# Patient Record
Sex: Female | Born: 1980 | Race: White | Hispanic: No | Marital: Married | Smoking: Former smoker
Health system: Southern US, Community
[De-identification: ages and names within clinical notes are randomized; demographics above are authoritative.]

## PROBLEM LIST (undated history)

## (undated) DIAGNOSIS — O99280 Endocrine, nutritional and metabolic diseases complicating pregnancy, unspecified trimester: Secondary | ICD-10-CM

## (undated) DIAGNOSIS — O4290 Premature rupture of membranes, unspecified as to length of time between rupture and onset of labor, unspecified weeks of gestation: Secondary | ICD-10-CM

## (undated) DIAGNOSIS — D6851 Activated protein C resistance: Secondary | ICD-10-CM

## (undated) DIAGNOSIS — Z8619 Personal history of other infectious and parasitic diseases: Secondary | ICD-10-CM

## (undated) DIAGNOSIS — J329 Chronic sinusitis, unspecified: Secondary | ICD-10-CM

## (undated) DIAGNOSIS — I499 Cardiac arrhythmia, unspecified: Secondary | ICD-10-CM

## (undated) DIAGNOSIS — O99119 Other diseases of the blood and blood-forming organs and certain disorders involving the immune mechanism complicating pregnancy, unspecified trimester: Secondary | ICD-10-CM

## (undated) DIAGNOSIS — E7212 Methylenetetrahydrofolate reductase deficiency: Secondary | ICD-10-CM

## (undated) DIAGNOSIS — D649 Anemia, unspecified: Secondary | ICD-10-CM

## (undated) HISTORY — DX: Methylenetetrahydrofolate reductase deficiency: E72.12

## (undated) HISTORY — DX: Cardiac arrhythmia, unspecified: I49.9

## (undated) HISTORY — DX: Endocrine, nutritional and metabolic diseases complicating pregnancy, unspecified trimester: O99.280

## (undated) HISTORY — DX: Anemia, unspecified: D64.9

## (undated) HISTORY — PX: WISDOM TOOTH EXTRACTION: SHX21

## (undated) HISTORY — DX: Activated protein C resistance: D68.51

## (undated) HISTORY — DX: Chronic sinusitis, unspecified: J32.9

## (undated) HISTORY — DX: Personal history of other infectious and parasitic diseases: Z86.19

---

## 2011-03-17 ENCOUNTER — Telehealth: Payer: Self-pay | Admitting: *Deleted

## 2011-03-17 NOTE — Telephone Encounter (Signed)
PER POF [id number] CALLED PATIENT TO INFORM THE PATIENT OF THE NEW DATE AND TIME OF THE APPOINTMENT ON 03-24-2011 STARTING AT 2:30 WITH LAB AND FCOUNSELING APPOINTMENT FOLLOWED BY DR.KHAN AT 3:00 FOR 60 MINUTES PATIENT CONFIRMED OVER THE PHONE THE DATE AND TIME.

## 2011-03-21 ENCOUNTER — Telehealth: Payer: Self-pay | Admitting: *Deleted

## 2011-03-21 NOTE — Telephone Encounter (Signed)
patient called and can not come at 2:30pm gave patient 3:30pm patient confirmed over the phone

## 2011-03-24 ENCOUNTER — Ambulatory Visit: Payer: Self-pay | Admitting: Oncology

## 2011-03-24 ENCOUNTER — Other Ambulatory Visit (HOSPITAL_BASED_OUTPATIENT_CLINIC_OR_DEPARTMENT_OTHER): Payer: BC Managed Care – PPO | Admitting: Oncology

## 2011-03-24 ENCOUNTER — Ambulatory Visit (HOSPITAL_BASED_OUTPATIENT_CLINIC_OR_DEPARTMENT_OTHER): Payer: BC Managed Care – PPO | Admitting: Oncology

## 2011-03-24 ENCOUNTER — Ambulatory Visit: Payer: BC Managed Care – PPO

## 2011-03-24 ENCOUNTER — Other Ambulatory Visit (HOSPITAL_BASED_OUTPATIENT_CLINIC_OR_DEPARTMENT_OTHER): Payer: BC Managed Care – PPO | Admitting: Lab

## 2011-03-24 VITALS — BP 100/63 | HR 50 | Temp 98.3°F | Ht 67.0 in | Wt 166.4 lb

## 2011-03-24 DIAGNOSIS — C50519 Malignant neoplasm of lower-outer quadrant of unspecified female breast: Secondary | ICD-10-CM

## 2011-03-24 DIAGNOSIS — O0000 Abdominal pregnancy without intrauterine pregnancy: Secondary | ICD-10-CM

## 2011-03-24 DIAGNOSIS — Z832 Family history of diseases of the blood and blood-forming organs and certain disorders involving the immune mechanism: Secondary | ICD-10-CM

## 2011-03-24 DIAGNOSIS — D696 Thrombocytopenia, unspecified: Secondary | ICD-10-CM | POA: Insufficient documentation

## 2011-03-24 DIAGNOSIS — D682 Hereditary deficiency of other clotting factors: Secondary | ICD-10-CM

## 2011-03-24 DIAGNOSIS — D689 Coagulation defect, unspecified: Secondary | ICD-10-CM

## 2011-03-24 DIAGNOSIS — D6859 Other primary thrombophilia: Secondary | ICD-10-CM

## 2011-03-24 DIAGNOSIS — E7212 Methylenetetrahydrofolate reductase deficiency: Secondary | ICD-10-CM

## 2011-03-24 DIAGNOSIS — C7951 Secondary malignant neoplasm of bone: Secondary | ICD-10-CM

## 2011-03-24 DIAGNOSIS — IMO0002 Reserved for concepts with insufficient information to code with codable children: Secondary | ICD-10-CM

## 2011-03-24 LAB — COMPREHENSIVE METABOLIC PANEL
Albumin: 3.4 g/dL — ABNORMAL LOW (ref 3.5–5.2)
Alkaline Phosphatase: 70 U/L (ref 39–117)
CO2: 26 mEq/L (ref 19–32)
Glucose, Bld: 89 mg/dL (ref 70–99)
Potassium: 3.5 mEq/L (ref 3.5–5.3)
Sodium: 135 mEq/L (ref 135–145)
Total Protein: 6.3 g/dL (ref 6.0–8.3)

## 2011-03-24 LAB — CBC WITH DIFFERENTIAL/PLATELET
Eosinophils Absolute: 0 10*3/uL (ref 0.0–0.5)
MONO#: 0.5 10*3/uL (ref 0.1–0.9)
MONO%: 7.7 % (ref 0.0–14.0)
NEUT#: 4.7 10*3/uL (ref 1.5–6.5)
RBC: 3.57 10*6/uL — ABNORMAL LOW (ref 3.70–5.45)
RDW: 13.3 % (ref 11.2–14.5)
WBC: 6.8 10*3/uL (ref 3.9–10.3)

## 2011-03-24 MED ORDER — ENOXAPARIN SODIUM 100 MG/ML ~~LOC~~ SOLN: 100.0000 mg | Freq: Every day | SUBCUTANEOUS | Status: DC

## 2011-03-27 ENCOUNTER — Telehealth: Payer: Self-pay | Admitting: Oncology

## 2011-03-27 NOTE — Telephone Encounter (Signed)
called pt lmovm with appt for 12/17 @ 11:15am. asked pt to rtn call to confirm appt

## 2011-03-28 NOTE — Progress Notes (Signed)
Amanda Bean [id number] Jul 26, 1980 30 y.o. 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   REFERRING PHYSICIAN: [name] CNP  HISTORY OF PRESENT ILLNESS:  Amanda Bean is a 30 y.o. female.  Very pleasant female who is accompanied by her husband today. She really has no medical problems. She is currently [redacted] week gestation about to enter her second trimester. She has a family history significant for strokes. Also there was family history notable for mother with factor V Leiden positivity. However she never had miscarriages. Patient describe maternal grandmother also have factor V Leiden and has had strokes. Patient has 3 maternal aunts who also have factor V Leiden mutation. Because of this patient also had her blood checked at went over her OB/GYN and infertility. And she too is found to have the factor V Leiden mutation. She also had the MT HFR gene mutation as well. Patient is heterozygote positive for factor V Leiden she personally has never had any evidence of blood clots. The full hypoechoic panel is reviewed  today by me. This was present in the patient's chart from her primary care physician's office. Clinically she is doing well  PAST MEDICAL HISTORY: No history of blood clots or previous pregnancy losses No history of strokes  FAMILY HISTORY: Patient's mother was recently diagnosed with factor V Leiden positivity. Patient's mother has never had any miscarriages. Paternal grandmother had factor V Leiden and has had strokes. She was the first case in the family. Patient has 3 maternal aunts who also are positive for factor V Leiden PR      Social History History  Substance Use Topics  . Smoking status: Not on file  . Smokeless tobacco: Not on file  . Alcohol Use: Not on file    No Known Allergies  Current Outpatient Prescriptions  Medication Sig Dispense Refill  .  enoxaparin (LOVENOX) 100 MG/ML SOLN Inject 1 mL (100 mg total) into the skin daily.  18.9 mL  12  . Prenatal MV-Min-Fe Fum-FA-DHA (PRENATAL 1 PO) Take 1 each by mouth daily.          REVIEW OF SYSTEMS:  Constitutional: negative Eyes: negative Ears, nose, mouth, throat, and face: negative Respiratory: negative Cardiovascular: negative Gastrointestinal: negative Genitourinary:negative Hematologic/lymphatic: negative Musculoskeletal:negative Neurological: negative  PHYSICAL EXAMINATION:  WUJ:WJXBJ, healthy, no distress, well nourished and well developed SKIN: skin color, texture, turgor are normal HEAD: Normocephalic, No masses, lesions, tenderness or abnormalities EYES: normal, PERRLA, EOMI, Conjunctiva are pink and non-injected, sclera clear EARS: External ears normal OROPHARYNX:no exudate, no erythema, lips, buccal mucosa, and tongue normal and dentition normal  NECK: supple, no adenopathy, no bruits, no JVD, thyroid normal size, non-tender, without nodularity LYMPH:  no palpable lymphadenopathy BREAST:not examined LUNGS: clear to auscultation , and palpation, clear to auscultation and percussion HEART: regular rate & rhythm, no murmurs and no gallops ABDOMEN:abdomen soft, non-tender, normal bowel sounds and no masses or organomegaly BACK: Back symmetric, no curvature., No CVA tenderness EXTREMITIES:no edema, no clubbing, no cyanosis  NEURO: alert & oriented x 3 with fluent speech, no focal motor/sensory deficits, gait normal, reflexes normal and symmetric  ASSESSMENT    30 year old female with as I don't positive factor V Leiden and positive for MT HFR mutation. She is now 6-[redacted] weeks gestation. 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 Patient has never had a personal history of blood clots. However  she is pregnant and she is at risk for clotting due to estrogenic state. Also she is at risk for having early miscarriage due to her underlying  disease. This is patient's first pregnancy. There is really no family history of present early pregnancy loss. Patient and I and her husband discussed at great length the diagnosis of hypocoagulability. She understands. She is also quite scared about the chance of losing her baby. We therefore recommended and discussed the possibility of using anticoagulation with Lovenox for the duration of her pregnancy and postpartum.    PLAN:    Recommendation  has been made to begin Lovenox at 1.5 mg per kilogram on a daily basis. This would be throughout her pregnancy. She was taught how to give herself injections. Postpartum I will recommend her remaining on the Lovenox for at least 6-8 weeks. She understands the risks and benefits of this. Obviously few weeks prior to her delivery date we would switch her to unfractionated heparin. [place] would be to have a safer delivery.  The patient and her husband  demonstrated understanding. And a prescription was sent to her pharmacy for Lovenox. We will monitor her CBC as well as low molecular weight heparin levels. I will plan on seeing her back in a few weeks time.     All questions were answered. The patient knows to call the clinic with any problems, questions or concerns. We can certainly see the patient much sooner if necessary.  Thank you so much for allowing me to participate in the care of Amanda Bean. I will continue to follow up the patient with you and assist in her care.  I spent 40 minutes counseling the patient face to face. The total time spent in the appointment was 60 minutes. Drue Second, MD Medical/Oncology [facility] [phone] (beeper) [phone] (Office)  03/28/2011, 5:55 PM 03/28/2011, 5:55 PM

## 2011-03-29 LAB — OB RESULTS CONSOLE RPR: RPR: NONREACTIVE

## 2011-03-29 LAB — OB RESULTS CONSOLE ANTIBODY SCREEN: Antibody Screen: NEGATIVE

## 2011-03-29 LAB — OB RESULTS CONSOLE RUBELLA ANTIBODY, IGM: Rubella: IMMUNE

## 2011-03-29 LAB — OB RESULTS CONSOLE GC/CHLAMYDIA: Gonorrhea: NEGATIVE

## 2011-04-21 ENCOUNTER — Telehealth: Payer: Self-pay | Admitting: *Deleted

## 2011-04-21 NOTE — Telephone Encounter (Signed)
patient changed appointment to 05-31-2011 starting at 3:00pm

## 2011-04-21 NOTE — Telephone Encounter (Signed)
return patient call on 04-21-2011

## 2011-04-24 ENCOUNTER — Other Ambulatory Visit: Payer: BC Managed Care – PPO | Admitting: Lab

## 2011-04-24 ENCOUNTER — Ambulatory Visit: Payer: BC Managed Care – PPO | Admitting: Oncology

## 2011-05-08 ENCOUNTER — Inpatient Hospital Stay (HOSPITAL_COMMUNITY): Admission: AD | Admit: 2011-05-08 | Payer: Self-pay | Source: Ambulatory Visit | Admitting: Obstetrics and Gynecology

## 2011-05-09 NOTE — L&D Delivery Note (Signed)
Cesarean Section Procedure Note  Indications: failure to progress: arrest of descent and failure to progress: arrest of dilation  Pre-operative Diagnosis: 39 week 6 day pregnancy.  Post-operative Diagnosis: same  Surgeon: Genia Del   Assistants: [name]  Anesthesia: Epidural anesthesia  ASA Class: 1   Procedure Details   The patient was seen in the Holding Room. The risks, benefits, complications, treatment options, and expected outcomes were discussed with the patient.  The patient concurred with the proposed plan, giving informed consent.  The site of surgery properly noted/marked. The patient was taken to Operating Room # 2, identified as Amanda Bean and the procedure verified as C-Section Delivery. A Time Out was held and the above information confirmed.  After induction of anesthesia, the patient was draped and prepped in the usual sterile manner. A Pfannenstiel incision was made and carried down through the subcutaneous tissue to the fascia. Fascial incision was made and extended transversely. The fascia was separated from the underlying rectus tissue superiorly and inferiorly. The peritoneum was identified and entered. Peritoneal incision was extended longitudinally. The utero-vesical peritoneal reflection was incised transversely and the bladder flap was bluntly freed from the lower uterine segment. A low transverse uterine incision was made. Delivered from cephalic presentation, occiput posterior Female with Apgar scores of 7 at one minute and 8 at five minutes. After the umbilical cord was clamped and cut cord blood was obtained for evaluation. The placenta was removed intact and appeared normal.  Pitocin and Methergine were given for uterine contraction. The uterine outline, tubes and ovaries appeared normal. The uterine incision was closed with running locked sutures of Vicryl-0, a second layer in a mattress stitch of Vicryl-0 was added.  Hemostasis was observed.  Lavage was carried out until clear. The fascia was then reapproximated with running sutures of Vicryl. The skin was reapproximated with Staples.  Instrument, sponge, and needle counts were correct prior the abdominal closure and at the conclusion of the case.    Estimated Blood Loss:  600 cc                   Specimens: Placenta         Complications:  None; patient tolerated the procedure well.         Disposition: PACU - hemodynamically stable.         Condition: stable  Attending Attestation: I was present and scrubbed for the entire procedure.  Genia Del MD  09/30/2011 at 12:53 am

## 2011-05-31 ENCOUNTER — Ambulatory Visit (HOSPITAL_BASED_OUTPATIENT_CLINIC_OR_DEPARTMENT_OTHER): Payer: BC Managed Care – PPO | Admitting: Oncology

## 2011-05-31 ENCOUNTER — Other Ambulatory Visit (HOSPITAL_BASED_OUTPATIENT_CLINIC_OR_DEPARTMENT_OTHER): Payer: BC Managed Care – PPO | Admitting: Lab

## 2011-05-31 VITALS — BP 106/67 | HR 67 | Temp 98.4°F | Wt 174.0 lb

## 2011-05-31 DIAGNOSIS — Z5181 Encounter for therapeutic drug level monitoring: Secondary | ICD-10-CM

## 2011-05-31 DIAGNOSIS — E7210 Disorders of sulfur-bearing amino-acid metabolism, unspecified: Secondary | ICD-10-CM

## 2011-05-31 DIAGNOSIS — D689 Coagulation defect, unspecified: Secondary | ICD-10-CM

## 2011-05-31 DIAGNOSIS — Z7901 Long term (current) use of anticoagulants: Secondary | ICD-10-CM

## 2011-05-31 DIAGNOSIS — D6859 Other primary thrombophilia: Secondary | ICD-10-CM

## 2011-05-31 DIAGNOSIS — D682 Hereditary deficiency of other clotting factors: Secondary | ICD-10-CM

## 2011-05-31 DIAGNOSIS — D696 Thrombocytopenia, unspecified: Secondary | ICD-10-CM

## 2011-05-31 LAB — CBC WITH DIFFERENTIAL/PLATELET
Eosinophils Absolute: 0.1 10*3/uL (ref 0.0–0.5)
MCV: 88.1 fL (ref 79.5–101.0)
MONO#: 0.7 10*3/uL (ref 0.1–0.9)
MONO%: 7.8 % (ref 0.0–14.0)
NEUT#: 6.2 10*3/uL (ref 1.5–6.5)
RBC: 3.37 10*6/uL — ABNORMAL LOW (ref 3.70–5.45)
RDW: 13.2 % (ref 11.2–14.5)
WBC: 9.1 10*3/uL (ref 3.9–10.3)
nRBC: 0 % (ref 0–0)

## 2011-05-31 LAB — PROTIME-INR: INR: 0.9 — ABNORMAL LOW (ref 2.00–3.50)

## 2011-05-31 NOTE — Progress Notes (Signed)
  OFFICE PROGRESS NOTE  CC  [name] CNP  DIAGNOSIS:  31 year old female with factor V Leiden and MT HFR mutation who is [redacted] weeks gestation  PRIOR THERAPY: 1. patient is currently [redacted] weeks pregnant and was started on Lovenox on 03/24/2011  CURRENT THERAPY: Lovenox subcutaneous daily for duration of her pregnancy.  INTERVAL HISTORY: Amanda Bean 31 y.o. female returns for followup visit today. Overall she is doing well she is without any significant complaints. She is tolerating the Lovenox very well without any problems. She is denying any fevers chills night sweats headaches  MEDICAL HISTORY:No past medical history on file.  ALLERGIES:   has no known allergies.  MEDICATIONS:  Current Outpatient Prescriptions  Medication Sig Dispense Refill  . enoxaparin (LOVENOX) 100 MG/ML SOLN Inject 1 mL (100 mg total) into the skin daily.  18.9 mL  12  . Prenatal MV-Min-Fe Fum-FA-DHA (PRENATAL 1 PO) Take 1 each by mouth daily.          SURGICAL HISTORY: No past surgical history on file.  REVIEW OF SYSTEMS:  Pertinent items are noted in HPI.   PHYSICAL EXAMINATION: Neck: no adenopathy, no carotid bruit, no JVD, supple, symmetrical, trachea midline and thyroid not enlarged, symmetric, no tenderness/mass/nodules Resp: clear to auscultation bilaterally and normal percussion bilaterally Cardio: regular rate and rhythm, S1, S2 normal, no murmur, click, rub or gallop GI: soft, non-tender; bowel sounds normal; no masses,  no organomegaly and Abdomen is protruded  secondary to pregnancy Extremities: extremities normal, atraumatic, no cyanosis or edema Neurologic: Alert and oriented X 3, normal strength and tone. Normal symmetric reflexes. Normal coordination and gait  ECOG PERFORMANCE STATUS: 0 - Asymptomatic  Blood pressure 106/67, pulse 67, temperature 98.4 F (36.9 C), temperature source Oral, weight 174 lb (78.926 kg).  LABORATORY DATA: Lab Results  Component Value Date   WBC 9.1  05/31/2011   HGB 10.4* 05/31/2011   HCT 29.7* 05/31/2011   MCV 88.1 05/31/2011   PLT 184 05/31/2011      Chemistry      Component Value Date/Time   NA 135 03/24/2011 1625   K 3.5 03/24/2011 1625   CL 101 03/24/2011 1625   CO2 26 03/24/2011 1625   BUN 12 03/24/2011 1625   CREATININE 0.65 03/24/2011 1625      Component Value Date/Time   CALCIUM 9.1 03/24/2011 1625   ALKPHOS 70 03/24/2011 1625   AST 16 03/24/2011 1625   ALT 19 03/24/2011 1625   BILITOT 0.5 03/24/2011 1625       RADIOGRAPHIC STUDIES:  No results found.  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 Patient is currently on Lovenox to prevent pregnancy loss. Overall she is doing well.   PLAN: I will plan on seeing the patient back in early May. Her expected date of delivery is towards the end of May around May 25. About 2 weeks prior to her delivery we will plan on putting her on unfractionated heparin. All questions were answered.   All questions were answered. The patient knows to call the clinic with any problems, questions or concerns. We can certainly see the patient much sooner if necessary.  I spent 20 minutes counseling the patient face to face. The total time spent in the appointment was 30 minutes.    Drue Second, MD Medical/Oncology [facility] [phone] (beeper) [phone] (Office)  05/31/2011, 9:06 PM

## 2011-08-23 ENCOUNTER — Telehealth: Payer: Self-pay | Admitting: *Deleted

## 2011-08-23 NOTE — Telephone Encounter (Signed)
Pt called with concerns:  " I will run out of Lovenox in about 10 days.  Do I refill it or should I go on the other medication instead?  The Lovenox is $100.00 and If I refill it and only use enough  until I switch to the other medication, then will I use the rest of it after the baby is born."   Pt due date 05/25.   F/U with MD 09/08/11- pt will run out of Lovenox prior to MD visit.  Will review with MD

## 2011-08-23 NOTE — Telephone Encounter (Signed)
Per MD, pt to refill lovenox and continue taking as directed. Pt will restart Lovenox after baby is born.   MD will review with pt "new medication" unfractionated heparin at next office visit 09/08/11. Pt verbalized understanding.

## 2011-08-23 NOTE — Telephone Encounter (Signed)
Patient should refill on lovenox

## 2011-08-29 ENCOUNTER — Telehealth: Payer: Self-pay | Admitting: Medical Oncology

## 2011-08-29 NOTE — Telephone Encounter (Signed)
Received call from OBGYN for Amanda Bean currently [redacted] weeks gestation and receiving Lovenox.  OB requesting patient be transitioned to Heparin between 36-[redacted] weeks gestation instead of waiting until [redacted] weeks gestation in case patient goes into labor early.  Patient scheduled to see Dr. Welton Flakes Sep 08, 2011  Will review with MD

## 2011-08-30 NOTE — Telephone Encounter (Signed)
We will switch her to unfractionated heparin on her next visit with me

## 2011-08-30 NOTE — Telephone Encounter (Signed)
Returned call to [name] CNM at [facility].  Per Dr. Welton Flakes patient to be switched to unfractionated herparin after next office visit on 5/3  Per [name], this should be fine.

## 2011-09-04 LAB — OB RESULTS CONSOLE GBS: GBS: NEGATIVE

## 2011-09-08 ENCOUNTER — Other Ambulatory Visit: Payer: BC Managed Care – PPO | Admitting: Lab

## 2011-09-08 ENCOUNTER — Ambulatory Visit (HOSPITAL_BASED_OUTPATIENT_CLINIC_OR_DEPARTMENT_OTHER): Payer: BC Managed Care – PPO | Admitting: Oncology

## 2011-09-08 ENCOUNTER — Other Ambulatory Visit: Payer: Self-pay | Admitting: *Deleted

## 2011-09-08 VITALS — BP 117/75 | HR 71 | Temp 98.4°F | Ht 67.0 in | Wt 191.0 lb

## 2011-09-08 DIAGNOSIS — D6859 Other primary thrombophilia: Secondary | ICD-10-CM

## 2011-09-08 DIAGNOSIS — O99119 Other diseases of the blood and blood-forming organs and certain disorders involving the immune mechanism complicating pregnancy, unspecified trimester: Secondary | ICD-10-CM

## 2011-09-08 DIAGNOSIS — Z862 Personal history of diseases of the blood and blood-forming organs and certain disorders involving the immune mechanism: Secondary | ICD-10-CM

## 2011-09-08 LAB — CBC WITH DIFFERENTIAL/PLATELET
BASO%: 0.4 % (ref 0.0–2.0)
HCT: 33.3 % — ABNORMAL LOW (ref 34.8–46.6)
MCHC: 34.2 g/dL (ref 31.5–36.0)
MONO#: 0.8 10*3/uL (ref 0.1–0.9)
NEUT%: 66.3 % (ref 38.4–76.8)
RDW: 13.5 % (ref 11.2–14.5)
WBC: 8.7 10*3/uL (ref 3.9–10.3)
lymph#: 2.1 10*3/uL (ref 0.9–3.3)
nRBC: 0 % (ref 0–0)

## 2011-09-08 LAB — HEPARIN ANTI-XA: Heparin LMW: 0.33 IU/mL

## 2011-09-08 MED ORDER — HEPARIN SODIUM (PORCINE) PF 5000 UNIT/0.5ML IJ SOLN: 10000.0000 [IU] | Freq: Two times a day (BID) | INTRAMUSCULAR | Status: DC

## 2011-09-11 ENCOUNTER — Telehealth: Payer: Self-pay | Admitting: Medical Oncology

## 2011-09-11 NOTE — Telephone Encounter (Signed)
[facility] pharmacy called on 5/3 will order PF heprin for pt. Notified pt her rx will be at [facility] as this is a difficult medication for some pharmacies to order or stock.  LMOVM for pt to call back confirming she is aware her Medication is at [facility]. Gave pt [facility] outpt pharmacy #

## 2011-09-13 NOTE — Progress Notes (Signed)
OFFICE PROGRESS NOTE  CC  [name] CNP  DIAGNOSIS:  31 year old female with factor V Leiden and MT HFR mutation who is [redacted] weeks gestation  PRIOR THERAPY: 1. patient is currently [redacted] weeks pregnant and was started on Lovenox on 03/24/2011  CURRENT THERAPY: Convert to preservative free unfractionated heparin  INTERVAL HISTORY: TAYLEIGH WETHERELL 31 y.o. female returns for followup visit today. Overall she is doing well she is without any significant complaints. She is tolerating the Lovenox very well without any problems. She is denying any fevers chills night sweats headaches  MEDICAL HISTORY:No past medical history on file.  ALLERGIES:   has no known allergies.  MEDICATIONS:  Current Outpatient Prescriptions  Medication Sig Dispense Refill  . enoxaparin (LOVENOX) 100 MG/ML SOLN Inject 1 mL (100 mg total) into the skin daily.  18.9 mL  12  . Prenatal MV-Min-Fe Fum-FA-DHA (PRENATAL 1 PO) Take 1 each by mouth daily.        . Heparin Sodium, Porcine, PF 5000 UNIT/0.5ML SOLN Inject 10,000 Units as directed every 12 (twelve) hours.  14 Syringe  5    SURGICAL HISTORY: No past surgical history on file.  REVIEW OF SYSTEMS:  Pertinent items are noted in HPI.   PHYSICAL EXAMINATION: Neck: no adenopathy, no carotid bruit, no JVD, supple, symmetrical, trachea midline and thyroid not enlarged, symmetric, no tenderness/mass/nodules Resp: clear to auscultation bilaterally and normal percussion bilaterally Cardio: regular rate and rhythm, S1, S2 normal, no murmur, click, rub or gallop GI: soft, non-tender; bowel sounds normal; no masses,  no organomegaly and Abdomen is protruded  secondary to pregnancy Extremities: extremities normal, atraumatic, no cyanosis or edema Neurologic: Alert and oriented X 3, normal strength and tone. Normal symmetric reflexes. Normal coordination and gait  ECOG PERFORMANCE STATUS: 0 - Asymptomatic  Blood pressure 117/75, pulse 71, temperature 98.4 F (36.9 C),  temperature source Oral, height 5\' 7"  (1.702 m), weight 191 lb (86.637 kg).  LABORATORY DATA: Lab Results  Component Value Date   WBC 8.7 09/08/2011   HGB 11.4* 09/08/2011   HCT 33.3* 09/08/2011   MCV 93.9 09/08/2011   PLT 152 09/08/2011      Chemistry      Component Value Date/Time   NA 135 03/24/2011 1625   K 3.5 03/24/2011 1625   CL 101 03/24/2011 1625   CO2 26 03/24/2011 1625   BUN 12 03/24/2011 1625   CREATININE 0.65 03/24/2011 1625      Component Value Date/Time   CALCIUM 9.1 03/24/2011 1625   ALKPHOS 70 03/24/2011 1625   AST 16 03/24/2011 1625   ALT 19 03/24/2011 1625   BILITOT 0.5 03/24/2011 1625       RADIOGRAPHIC STUDIES:  No results found.  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 Patient is currently on Lovenox to prevent pregnancy loss. Overall she is doing well.   PLAN: Today we will switch her over to  Unfractionated preservative free heparin 10,000 units to be injected Day until patient goes into labor then this can be continued. Once she has delivered her baby then she certainly can go back on Lovenox. We will plan on continuing the Lovenox postpartum for 6-8 weeks time to help prevent postpartum thrombosis in the patient. All of this was explained to the patient and she is in agreement. I will plan on seeing the patient back after she delivers her child. For the heparin was sent to patient's pharmacy. And she was instructed on how to inject  herself.   All questions were answered. The patient knows to call the clinic with any problems, questions or concerns. We can certainly see the patient much sooner if necessary.  I spent 20 minutes counseling the patient face to face. The total time spent in the appointment was 30 minutes.    Drue Second, MD Medical/Oncology [facility] [phone] (beeper) [phone] (Office)  09/13/2011, 9:49 AM

## 2011-09-25 ENCOUNTER — Telehealth (HOSPITAL_COMMUNITY): Payer: Self-pay | Admitting: *Deleted

## 2011-09-25 ENCOUNTER — Encounter (HOSPITAL_COMMUNITY): Payer: Self-pay | Admitting: *Deleted

## 2011-09-25 NOTE — Telephone Encounter (Signed)
Preadmission screen  

## 2011-09-28 ENCOUNTER — Inpatient Hospital Stay (HOSPITAL_COMMUNITY)
Admission: RE | Admit: 2011-09-28 | Discharge: 2011-10-03 | DRG: 371 | Disposition: A | Discharge status: Home / Self Care | Payer: BC Managed Care – PPO | Source: Ambulatory Visit | Attending: Obstetrics and Gynecology | Admitting: Obstetrics and Gynecology

## 2011-09-28 ENCOUNTER — Encounter (HOSPITAL_COMMUNITY): Payer: Self-pay

## 2011-09-28 DIAGNOSIS — O34219 Maternal care for unspecified type scar from previous cesarean delivery: Secondary | ICD-10-CM | POA: Diagnosis present

## 2011-09-28 DIAGNOSIS — D689 Coagulation defect, unspecified: Secondary | ICD-10-CM | POA: Diagnosis present

## 2011-09-28 DIAGNOSIS — O99119 Other diseases of the blood and blood-forming organs and certain disorders involving the immune mechanism complicating pregnancy, unspecified trimester: Secondary | ICD-10-CM | POA: Diagnosis present

## 2011-09-28 DIAGNOSIS — D6851 Activated protein C resistance: Secondary | ICD-10-CM

## 2011-09-28 HISTORY — DX: Other diseases of the blood and blood-forming organs and certain disorders involving the immune mechanism complicating pregnancy, unspecified trimester: D68.51

## 2011-09-28 HISTORY — DX: Other diseases of the blood and blood-forming organs and certain disorders involving the immune mechanism complicating pregnancy, unspecified trimester: O99.119

## 2011-09-28 HISTORY — DX: Activated protein C resistance: D68.51

## 2011-09-28 LAB — CBC
MCV: 92.9 fL (ref 78.0–100.0)
Platelets: 158 10*3/uL (ref 150–400)
RBC: 3.64 MIL/uL — ABNORMAL LOW (ref 3.87–5.11)
WBC: 11.6 10*3/uL — ABNORMAL HIGH (ref 4.0–10.5)

## 2011-09-28 MED ORDER — MISOPROSTOL 25 MCG QUARTER TABLET
25.0000 ug | ORAL_TABLET | ORAL | Status: DC | PRN
  Administered 2011-09-28: 25 ug via VAGINAL
  Filled 2011-09-28: qty 0.25

## 2011-09-28 MED ORDER — LIDOCAINE HCL (PF) 1 % IJ SOLN: 30.0000 mL | INTRAMUSCULAR | Status: DC | PRN

## 2011-09-28 MED ORDER — OXYCODONE-ACETAMINOPHEN 5-325 MG PO TABS: 1.0000 | ORAL_TABLET | ORAL | Status: DC | PRN

## 2011-09-28 MED ORDER — LACTATED RINGERS IV SOLN
500.0000 mL | INTRAVENOUS | Status: DC | PRN
  Administered 2011-09-29: 125 mL via INTRAVENOUS
  Administered 2011-09-29 (×2): 500 mL via INTRAVENOUS
  Administered 2011-09-29: 125 mL via INTRAVENOUS

## 2011-09-28 MED ORDER — FERROUS FUMARATE 325 (106 FE) MG PO TABS
1.0000 | ORAL_TABLET | Freq: Every day | ORAL | Status: DC
  Filled 2011-09-28 (×2): qty 1

## 2011-09-28 MED ORDER — IBUPROFEN 600 MG PO TABS: 600.0000 mg | ORAL_TABLET | Freq: Four times a day (QID) | ORAL | Status: DC | PRN

## 2011-09-28 MED ORDER — ACETAMINOPHEN 325 MG PO TABS
650.0000 mg | ORAL_TABLET | ORAL | Status: DC | PRN
  Administered 2011-09-29: 650 mg via ORAL
  Filled 2011-09-28: qty 2

## 2011-09-28 MED ORDER — OXYTOCIN 10 UNIT/ML IJ SOLN: 10.0000 [IU] | Freq: Once | INTRAMUSCULAR | Status: DC

## 2011-09-28 MED ORDER — CITRIC ACID-SODIUM CITRATE 334-500 MG/5ML PO SOLN
30.0000 mL | ORAL | Status: DC | PRN
  Administered 2011-09-29: 30 mL via ORAL
  Filled 2011-09-28: qty 15

## 2011-09-28 MED ORDER — NALBUPHINE SYRINGE 5 MG/0.5 ML
5.0000 mg | INJECTION | INTRAMUSCULAR | Status: DC | PRN
  Administered 2011-09-29 (×2): 5 mg via INTRAVENOUS
  Filled 2011-09-28 (×2): qty 0.5

## 2011-09-28 MED ORDER — TERBUTALINE SULFATE 1 MG/ML IJ SOLN: 0.2500 mg | Freq: Once | INTRAMUSCULAR | Status: AC | PRN

## 2011-09-28 MED ORDER — FLEET ENEMA 7-19 GM/118ML RE ENEM: 1.0000 | ENEMA | RECTAL | Status: DC | PRN

## 2011-09-28 MED ORDER — OXYTOCIN 20 UNITS IN LACTATED RINGERS INFUSION - SIMPLE: 125.0000 mL/h | Freq: Once | INTRAVENOUS | Status: DC

## 2011-09-28 MED ORDER — ZOLPIDEM TARTRATE 10 MG PO TABS
10.0000 mg | ORAL_TABLET | Freq: Every evening | ORAL | Status: DC | PRN
  Administered 2011-09-28: 10 mg via ORAL
  Filled 2011-09-28: qty 1

## 2011-09-28 MED ORDER — ONDANSETRON HCL 4 MG/2ML IJ SOLN
4.0000 mg | Freq: Four times a day (QID) | INTRAMUSCULAR | Status: DC | PRN
  Administered 2011-09-29: 4 mg via INTRAVENOUS
  Filled 2011-09-28: qty 2

## 2011-09-28 MED ORDER — OXYTOCIN BOLUS FROM INFUSION
500.0000 mL | Freq: Once | INTRAVENOUS | Status: DC
  Filled 2011-09-28: qty 1000
  Filled 2011-09-28: qty 500

## 2011-09-28 NOTE — Progress Notes (Signed)
Amanda Bean is a 31 y.o. G1P0 at [redacted]w[redacted]d by LMP admitted for induction of labor due to Low amniotic fluid. and FVL on anticoag therapy.  Subjective: Comfortable, good FM.  Objective: Filed Vitals:   09/28/11 2050  BP: 140/88  Pulse: 76  Temp: 97.9 F (36.6 C)  TempSrc: Oral  Resp: 20  Height: 5\' 7"  (1.702 m)  Weight: 86.183 kg (190 lb)          FHT:  FHR: 120 bpm, variability: moderate,  accelerations:  Present,  decelerations:  Absent UC:   none SVE:    1-2 / 70/-2 Vertex, memb intact  Labs:   Basename 09/28/11 2050  WBC 11.6*  HGB 11.9*  HCT 33.8*  PLT 158    Assessment / Plan: Induction of labor due to oligo / FVL Cervical balloon placed with moderate difficulty, sterile spec used, inflated w/ 60 cc NS Cytotec to posterior cervix q4 hrs PRN Labor: cervical ripening initiated. Preeclampsia:  n/a Fetal Wellbeing:  Category I Pain Control:  IV narcotics PRN overnight, plan epidural in active labor I/D:  GBS neg Anticipated MOD:  cautious attempt at NSVB  Brice Kossman 09/28/2011, 8:38 PM

## 2011-09-28 NOTE — H&P (Signed)
Amanda Bean is a 31 y.o. G1P0 at [redacted]w[redacted]d presenting for IOL.  Good fetal movement, No vaginal bleeding, no leaking fluid.  Prenatal Course Source of Care: [facility] Renae Fickle, CNM primary,  with onset of care at 8 weeks Pregnancy complications or risk: FVL and MTHFR mutation, on anticoagulation therapy, managed by heme - Dr. Welton Flakes Was on Lovenox 100 mg SQ daily until 37 wks, converted to heparin BID, last dose 5/22 pm. Oligo 4.4 cm on 5/22, BPP 8/8 and NST reactive Rh negative, rhogam given at 28 wks Low-lying placenta resolved by 22 wks Mild anemia supplemented with oral Fe  Current medications: Prescriptions prior to admission  Medication Sig Dispense Refill  . acetaminophen (TYLENOL) 325 MG suppository Place 325 mg rectally every 4 (four) hours as needed. Pain      . enoxaparin (LOVENOX) 100 MG/ML SOLN Inject 1 mL (100 mg total) into the skin daily.  18.9 mL  12  . ferrous fumarate (HEMOCYTE - 106 MG FE) 325 (106 FE) MG TABS Take 1 tablet by mouth.      . Heparin Sodium, Porcine, PF 5000 UNIT/0.5ML SOLN Inject 10,000 Units as directed every 12 (twelve) hours.  14 Syringe  5  . Prenatal MV-Min-Fe Fum-FA-DHA (PRENATAL 1 PO) Take 1 each by mouth daily.          OB History    Grav Para Term Preterm Abortions TAB SAB Ect Mult Living   1              Past Medical History  Diagnosis Date  . Factor V Leiden mutation   . Anemia   . Dysrhythmia     palpitations  . Unspecified sinusitis (chronic)   . History of chicken pox   . MTHFR deficiency complicating pregnancy, unspecified trimester   . Factor V Leiden mutation complicating pregnancy 09/28/2011   Past Surgical History  Procedure Date  . Wisdom tooth extraction    Family History: family history includes Alcohol abuse in her brother; Anesthesia problems in her cousin; Depression in her maternal grandmother; Factor V Leiden deficiency in her maternal aunt and mother; Other in her maternal aunt and mother; Stroke in her maternal  grandmother; and Thyroid disease in her maternal aunt, maternal grandmother, and mother. Social History:  reports that she quit smoking about 9 years ago. She has never used smokeless tobacco. She reports that she does not drink alcohol or use illicit drugs.  Review of Systems - Negative except as noted     Blood pressure 140/88, pulse 76, temperature 97.9 F (36.6 C), temperature source Oral, resp. rate 20, height 5\' 7"  (1.702 m), weight 86.183 kg (190 lb), last menstrual period 12/24/2010.  Physical Exam: Blood pressure 140/88, pulse 76, temperature 97.9 F (36.6 C), temperature source Oral, resp. rate 20, height 5\' 7"  (1.702 m), weight 86.183 kg (190 lb), last menstrual period 12/24/2010. General: NAD Heart: RRR, no murmurs Lungs: CTA b/l  Abd: Soft, NT, EFW 7  Ext: no edema Neuro: DTRs normal    Membranes: intact Vaginal bleeding: none  Digital Cervical Exam: Dilatation 1-2   Effacement 70  Station -2  Presentation vertex FHR:  Baseline rate 120   Variability moderate  Accelerations present  Decelerations none Contractions: few, mild  Pertinent Labs/Studies:   Lab Results  Component Value Date   WBC 11.6* 09/28/2011   HGB 11.9* 09/28/2011   HCT 33.8* 09/28/2011   MCV 92.9 09/28/2011   PLT 158 09/28/2011     Prenatal labs:  ABO, Rh: O/Negative/-- (11/21 0000) Antibody: Negative (11/21 0000) Rubella:  immune RPR: Nonreactive (11/21 0000)  HBsAg: Negative (11/21 0000)  HIV: Non-reactive (11/21 0000)  GBS: Negative (04/29 0000)  1 hr Glucola wnl  Genetic screening declined Ultrasound: Weeks 18 Result nl female anatomy, low lying posterior placenta resolved by 22 wks Placental synechiae along inferior edge 35 wks sono EFW 6'4, nl AFI  Assessment: 31 y.o. G1P0 at [redacted]w[redacted]d, FVL, +MTHFR, stopped anticoag therapy 24 hours prior to IOL   1. Fetal Wellbeing: Category 1  2. Borderline pelvis 3. Pain Control: desires epidural in active labor 4. GBS: neg   Plan:  1. Admit  to BS, cervical balloon and cytotec for cervical ripening 2. Routine L&D orders 3. Analgesia/anesthesia PRN  4. Will resume Lovenox at 24 hrs PP for 6-12 wks PP per hematology (Dr. Welton Flakes) recommendation     Consultant: Dr. Alesia Banda 09/28/2011, 9:16 PM

## 2011-09-29 ENCOUNTER — Encounter (HOSPITAL_COMMUNITY): Payer: Self-pay | Admitting: Anesthesiology

## 2011-09-29 ENCOUNTER — Encounter (HOSPITAL_COMMUNITY): Payer: Self-pay

## 2011-09-29 ENCOUNTER — Encounter (HOSPITAL_COMMUNITY)
Admission: RE | Disposition: A | Discharge status: Home / Self Care | Payer: Self-pay | Source: Ambulatory Visit | Attending: Obstetrics and Gynecology

## 2011-09-29 ENCOUNTER — Inpatient Hospital Stay (HOSPITAL_COMMUNITY): Payer: BC Managed Care – PPO | Admitting: Anesthesiology

## 2011-09-29 ENCOUNTER — Inpatient Hospital Stay (HOSPITAL_COMMUNITY): Admission: RE | Admit: 2011-09-29 | Payer: BC Managed Care – PPO | Source: Ambulatory Visit

## 2011-09-29 LAB — RPR: RPR Ser Ql: NONREACTIVE

## 2011-09-29 SURGERY — Surgical Case
Anesthesia: Regional

## 2011-09-29 MED ORDER — LIDOCAINE-EPINEPHRINE (PF) 2 %-1:200000 IJ SOLN
INTRAMUSCULAR | Status: AC
  Filled 2011-09-29: qty 20

## 2011-09-29 MED ORDER — LACTATED RINGERS IV SOLN
INTRAVENOUS | Status: DC
  Administered 2011-09-29: 125 mL/h via INTRAUTERINE
  Administered 2011-09-29: 600 mL/h via INTRAUTERINE

## 2011-09-29 MED ORDER — PHENYLEPHRINE 40 MCG/ML (10ML) SYRINGE FOR IV PUSH (FOR BLOOD PRESSURE SUPPORT)
80.0000 ug | PREFILLED_SYRINGE | INTRAVENOUS | Status: DC | PRN
  Filled 2011-09-29 (×2): qty 5

## 2011-09-29 MED ORDER — SODIUM BICARBONATE 8.4 % IV SOLN
INTRAVENOUS | Status: AC
  Filled 2011-09-29: qty 50

## 2011-09-29 MED ORDER — EPHEDRINE 5 MG/ML INJ
10.0000 mg | INTRAVENOUS | Status: DC | PRN
  Filled 2011-09-29 (×2): qty 4

## 2011-09-29 MED ORDER — OXYTOCIN 20 UNITS IN LACTATED RINGERS INFUSION - SIMPLE
1.0000 m[IU]/min | INTRAVENOUS | Status: DC
  Administered 2011-09-29: 2 m[IU]/min via INTRAVENOUS

## 2011-09-29 MED ORDER — TERBUTALINE SULFATE 1 MG/ML IJ SOLN: 0.2500 mg | Freq: Once | INTRAMUSCULAR | Status: DC | PRN

## 2011-09-29 MED ORDER — LACTATED RINGERS IV SOLN
INTRAVENOUS | Status: DC
  Administered 2011-09-29: 125 mL/h via INTRAUTERINE
  Administered 2011-09-30: via INTRAUTERINE

## 2011-09-29 MED ORDER — LACTATED RINGERS IV SOLN
500.0000 mL | Freq: Once | INTRAVENOUS | Status: AC
  Administered 2011-09-29: 500 mL via INTRAVENOUS

## 2011-09-29 MED ORDER — DIPHENHYDRAMINE HCL 50 MG/ML IJ SOLN: 12.5000 mg | INTRAMUSCULAR | Status: DC | PRN

## 2011-09-29 MED ORDER — EPHEDRINE 5 MG/ML INJ: 10.0000 mg | INTRAVENOUS | Status: DC | PRN

## 2011-09-29 MED ORDER — LIDOCAINE HCL (PF) 1 % IJ SOLN
INTRAMUSCULAR | Status: DC | PRN
  Administered 2011-09-29 (×2): 5 mL

## 2011-09-29 MED ORDER — BUPIVACAINE HCL (PF) 0.25 % IJ SOLN
INTRAMUSCULAR | Status: AC
  Filled 2011-09-29: qty 30

## 2011-09-29 MED ORDER — FENTANYL 2.5 MCG/ML BUPIVACAINE 1/10 % EPIDURAL INFUSION (WH - ANES)
14.0000 mL/h | INTRAMUSCULAR | Status: DC
  Administered 2011-09-29 (×5): 14 mL/h via EPIDURAL
  Filled 2011-09-29 (×5): qty 60

## 2011-09-29 MED ORDER — PHENYLEPHRINE 40 MCG/ML (10ML) SYRINGE FOR IV PUSH (FOR BLOOD PRESSURE SUPPORT): 80.0000 ug | PREFILLED_SYRINGE | INTRAVENOUS | Status: DC | PRN

## 2011-09-29 MED ORDER — AMPICILLIN-SULBACTAM SODIUM 3 (2-1) G IJ SOLR
3.0000 g | Freq: Four times a day (QID) | INTRAMUSCULAR | Status: DC
  Administered 2011-09-29 (×2): 3 g via INTRAVENOUS
  Filled 2011-09-29 (×3): qty 3

## 2011-09-29 MED ORDER — MORPHINE SULFATE 0.5 MG/ML IJ SOLN
INTRAMUSCULAR | Status: AC
  Filled 2011-09-29: qty 10

## 2011-09-29 MED ORDER — OXYTOCIN 10 UNIT/ML IJ SOLN
INTRAMUSCULAR | Status: AC
  Filled 2011-09-29: qty 1

## 2011-09-29 SURGICAL SUPPLY — 36 items
CLOTH BEACON ORANGE TIMEOUT ST (SAFETY) ×2 IMPLANT
CONTAINER PREFILL 10% NBF 15ML (MISCELLANEOUS) IMPLANT
ELECT REM PT RETURN 9FT ADLT (ELECTROSURGICAL) ×2
ELECTRODE REM PT RTRN 9FT ADLT (ELECTROSURGICAL) ×1 IMPLANT
EXTRACTOR VACUUM M CUP 4 TUBE (SUCTIONS) IMPLANT
GLOVE BIO SURGEON STRL SZ 6.5 (GLOVE) IMPLANT
GLOVE BIOGEL PI IND STRL 6.5 (GLOVE) ×2 IMPLANT
GLOVE BIOGEL PI IND STRL 7.0 (GLOVE) ×1 IMPLANT
GLOVE BIOGEL PI IND STRL 8.5 (GLOVE) ×1 IMPLANT
GLOVE BIOGEL PI INDICATOR 6.5 (GLOVE) ×2
GLOVE BIOGEL PI INDICATOR 7.0 (GLOVE) ×1
GLOVE BIOGEL PI INDICATOR 8.5 (GLOVE) ×1
GLOVE SURG SS PI 7.5 STRL IVOR (GLOVE) ×4 IMPLANT
GOWN PREVENTION PLUS LG XLONG (DISPOSABLE) ×6 IMPLANT
KIT ABG SYR 3ML LUER SLIP (SYRINGE) IMPLANT
NEEDLE HYPO 22GX1.5 SAFETY (NEEDLE) ×2 IMPLANT
NEEDLE HYPO 25X5/8 SAFETYGLIDE (NEEDLE) ×2 IMPLANT
PACK C SECTION WH (CUSTOM PROCEDURE TRAY) ×2 IMPLANT
RTRCTR C-SECT PINK 25CM LRG (MISCELLANEOUS) IMPLANT
SLEEVE SCD COMPRESS KNEE MED (MISCELLANEOUS) ×2 IMPLANT
SPONGE LAP 18X18 X RAY DECT (DISPOSABLE) ×2 IMPLANT
STAPLER VISISTAT 35W (STAPLE) ×2 IMPLANT
SUT PLAIN 0 NONE (SUTURE) IMPLANT
SUT PLAIN 2 0 XLH (SUTURE) ×2 IMPLANT
SUT VIC AB 0 CT1 27 (SUTURE) ×2
SUT VIC AB 0 CT1 27XBRD ANBCTR (SUTURE) ×2 IMPLANT
SUT VIC AB 0 CTX 36 (SUTURE) ×2
SUT VIC AB 0 CTX36XBRD ANBCTRL (SUTURE) ×2 IMPLANT
SUT VIC AB 2-0 CT1 27 (SUTURE) ×1
SUT VIC AB 2-0 CT1 TAPERPNT 27 (SUTURE) ×1 IMPLANT
SUT VIC AB 3-0 SH 27 (SUTURE)
SUT VIC AB 3-0 SH 27X BRD (SUTURE) IMPLANT
SYR CONTROL 10ML LL (SYRINGE) ×2 IMPLANT
TOWEL OR 17X24 6PK STRL BLUE (TOWEL DISPOSABLE) ×4 IMPLANT
TRAY FOLEY CATH 14FR (SET/KITS/TRAYS/PACK) IMPLANT
WATER STERILE IRR 1000ML POUR (IV SOLUTION) ×4 IMPLANT

## 2011-09-29 NOTE — Progress Notes (Signed)
S: Pelvic pressure present, no further progress since 1800 Dr. Seymour Bars called for consult, manually attempting to rotate OP presentation.   OCeasar Mons Vitals:   09/29/11 2001 09/29/11 2030 09/29/11 2100 09/29/11 2130  BP: 114/94 117/55 117/39 115/75  Pulse: 91 72 88 96  Temp: 97.8 F (36.6 C)     TempSrc: Oral     Resp: 18 18 18 18   Height:      Weight:      SpO2:         FHT:  FHR: 130 bpm, variability: moderate,  accelerations:  Present,  decelerations:  Absent UC:   IUPC q 2-3 min, good pattern but low amplitude ctx, MVU 150-170, occasional tachysysole but FHT's reassuring  SVE:   Dilation: 8 Effacement (%): 90 Station: -1 Exam by:: Dr. Seymour Bars   A / P: Protracted active phase  Fetal Wellbeing:  Category I Pain Control:  Epidural  Anticipated MOD:  Suspect CPD, will continue w/ labor and position changes for 1-2 hours as long as FHR reassuring, since patient strongly not desiring cesarean section. Patient aware likely need for C/S, anticipatory guidance. Patient upset and teary, but understanding.   Amanda Bean 09/29/2011, 10:00 PM

## 2011-09-29 NOTE — Progress Notes (Signed)
S: Regular ctx since AROM at 0410, patient laboring in variable positions - birthing ball, ambulating in room, shower / hydrotherapy. Rpt Nubain dose at 0700. Now ctx stronger and more frequent, using breathing techniques to cope but tired.  O: Filed Vitals:   09/29/11 0630 09/29/11 0704 09/29/11 0732 09/29/11 0838  BP:   125/79 117/58  Pulse:   73 71  Temp:  97.9 F (36.6 C)    TempSrc:  Oral    Resp: 20 20  20   Height:      Weight:         FHT:  FHR: 120, mod variability, + accel's, no decel's. Intermittent EFM UC:   regular, every 2-3 minutes/ palp mod SVE:   Dilation: 6 Effacement (%): 90 Station: -2 Exam by:: Spike Desilets Cervix v. Posterior Suspect OP/acynclitic  A / P: Protracted active phase  Fetal Wellbeing:  Category I Pain Control:  Epidural now  Anticipated MOD:  attempt NSVD, plan IUPC / Pitocin PRN  Amanda Bean 09/29/2011, 9:11 AM

## 2011-09-29 NOTE — Progress Notes (Signed)
S: Doing well, pain controlled. Alternating positions exaggerated lateral to semi fowler.   O: Filed Vitals:   09/29/11 1201 09/29/11 1231 09/29/11 1301 09/29/11 1331  BP: 114/57 90/55 118/76 108/68  Pulse: 69 82 85 89  Temp:   97.5 F (36.4 C)   TempSrc:   Axillary   Resp:  20 18 20   Height:      Weight:      SpO2:         FHT:  FHR: 120 bpm, variability: moderate,  accelerations:  Present,  decelerations:  Present small variable w/ repositioning UC:   Q69min, MVU 190. Pitocin 6 mu /min SVE:   Dilation: 6 Effacement (%): 90 Station: -1 Exam by:: Howard Bunte,CNM Minimal progress, some caput, but acynclitic unresolved  A / P: Protracted labor, inadequate contractile strength Pitocin augmentation,  titrate to MVU>200  Fetal Wellbeing:  Category I Pain Control:  Epidural  Anticipated MOD:  NSVD if able to facilitate fetal descent and rotation, exaggerated lateral postioning, rotate q 30-60 min, increase pitocin to adequate MVU  Joeann Steppe 09/29/2011, 2:07 PM

## 2011-09-29 NOTE — Progress Notes (Signed)
Dr. Seymour Bars at the bedside to discuss risks and benefits of cesearan section delivery. Pt states understanding. Informed consents signed.

## 2011-09-29 NOTE — Progress Notes (Signed)
S: S/P amnioinfusion, Unasyn, Tylenol. Pitocin restarted. Labored in HK position, then lateral again, now reports rectal and pelvic pressure w/ ctx.    O: Filed Vitals:   09/29/11 1732 09/29/11 1801 09/29/11 1831 09/29/11 1901  BP: 130/54 100/67 116/57 120/74  Pulse: 75 74 71 87  Temp:   98.1 F (36.7 C)   TempSrc:   Axillary   Resp: 20 20 18    Height:      Weight:      SpO2:         FHT:  FHR: 130 bpm, variability: moderate,  accelerations:  Present,  decelerations:  Present earlies UC:   IUPC, Q 2-3 min, 150-190 MVU SVE:   Dilation: 8 Effacement (%): 90 Station: -1/0 Exam by:: Renae Fickle, CNM Cervix anterior now, slight edema   A / P: Protracted active phase  Fetal Wellbeing:  Category I Pain Control:  Epidural  Anticipated MOD:  continue w/ attempt at NSVB, slow progress  Amanda Bean 09/29/2011, 6:30 PM

## 2011-09-29 NOTE — Progress Notes (Signed)
S: Doing well, pain w/ ctx since 0100, s/p one dose of Nubain, + show   O: Filed Vitals:   09/28/11 2050 09/28/11 2053  BP: 140/88 140/88  Pulse: 76 76  Temp: 97.9 F (36.6 C)   TempSrc: Oral   Resp: 20   Height: 5\' 7"  (1.702 m)   Weight: 86.183 kg (190 lb)      FHT:  130, mod variability, slightly decreased since Nubain, + accel's, quick variables to 90 x2 UC:   regular, every 2-3 minutes, mild- mod SVE:   Dilation: 6 Effacement (%): 80 Station: -2 Exam by:: Colon Flattery, CNM AROM w/ SVE, clear AF Cervical balloon DCed, + show A / P: Induction of labor due to oligo and FVL,  S/p cervical balloon and cytotec x 1 dose for ripening, will allow for physiologic labor to ensue w/ AROM, Pitocin PRN if no progress in next 3-4 hours.   Fetal Wellbeing:  Category I Pain Control:  Labor support without medications and will consider Epidural PRN, pt. OOB to ambulate for comfort and encourage fetal rotation and descent.  Anticipated MOD:  NSVD / cautious, vertex high, pelvis borderline, flat arch  Keaundra Stehle 09/29/2011, 4:49 AM

## 2011-09-29 NOTE — Progress Notes (Signed)
Dr. Seymour Bars at the bedside to try and manually turn the baby. Will continue to monitor.

## 2011-09-29 NOTE — Anesthesia Preprocedure Evaluation (Signed)
Anesthesia Evaluation  Patient identified by MRN, date of birth, ID band Patient awake    Reviewed: Allergy & Precautions, H&P , Patient's Chart, lab work & pertinent test results  Airway Mallampati: II TM Distance: >3 FB Neck ROM: full    Dental No notable dental hx.    Pulmonary neg pulmonary ROS,  breath sounds clear to auscultation  Pulmonary exam normal       Cardiovascular negative cardio ROS  + dysrhythmias Rhythm:regular Rate:Normal     Neuro/Psych negative neurological ROS  negative psych ROS   GI/Hepatic negative GI ROS, Neg liver ROS,   Endo/Other  negative endocrine ROS  Renal/GU negative Renal ROS     Musculoskeletal   Abdominal   Peds  Hematology negative hematology ROS (+)   Anesthesia Other Findings Factor V Leiden mutation     Anemia        Dysrhythmia   palpitations Unspecified sinusitis (chronic)        History of chicken pox     MTHFR deficiency complicating pregnancy, unspecified trimester        Factor V Leiden mutation complicating pregnancy 5    Reproductive/Obstetrics (+) Pregnancy                           Anesthesia Physical Anesthesia Plan  ASA: III  Anesthesia Plan: Epidural   Post-op Pain Management:    Induction:   Airway Management Planned:   Additional Equipment:   Intra-op Plan:   Post-operative Plan:   Informed Consent: I have reviewed the patients History and Physical, chart, labs and discussed the procedure including the risks, benefits and alternatives for the proposed anesthesia with the patient or authorized representative who has indicated his/her understanding and acceptance.     Plan Discussed with:   Anesthesia Plan Comments:         Anesthesia Quick Evaluation

## 2011-09-29 NOTE — Progress Notes (Signed)
S: Tired and frustrated with lack of progress. Epidural effective. +HA Does not want cesarean section, desires to continue labor as long as fetus status reassuring. Discussed at length the physiology of protracted labor / arrest of dilation given vertex malposition, patient understands risks of infection, uterine atony PP, increased incidence of NICU admit for infant w/ protracted labor.  O: Filed Vitals:   09/29/11 1501 09/29/11 1531 09/29/11 1601 09/29/11 1631  BP: 103/49 109/57 125/75 130/77  Pulse: 79 82 76 82  Temp:  98.3 F (36.8 C)  97.9 F (36.6 C)  TempSrc:    Oral  Resp:  20  20  Height:      Weight:      SpO2:         FHT:  FHR: 135 bpm, variability: moderate,  accelerations:  Present,  decelerations:  Present few mild variables w/ exam UC:   IUPC q2 min, runs of tachysistole x 10 min at a time, MVU 190-200. Pitocinat 16 mu/min -  halved then off altogether for 30 min Amnioinfusion 300 cc bolus then 125 cc/hr for dystotic pattern  Feels warm to touch but temp wnl  SVE:   Dilation: 7 Effacement (%): 90 Station: -1 Exam by:: lavoie  Direct OP presentation  A / P: Protracted active phase Restart Pitocin once amnioinfusion bolus complete. Will attempt HK position to enable fetal rotation and descent.  ROM x 12 hrs - Unasyn 3 gm IVPB  Q 6 hrs  Fetal Wellbeing:  Category I Pain Control:  Epidural  Anticipated MOD:  continue w/ attempt at NSVD given cervical change  Dr. Seymour Bars at [facility] for consult - agrees w/ POC.  Amanda Bean 09/29/2011, 4:43 PM

## 2011-09-29 NOTE — Progress Notes (Signed)
S: Doing well, pain controlled,  Epidural in place, good mobility.   O: Filed Vitals:   09/29/11 1009 09/29/11 1031 09/29/11 1101 09/29/11 1131  BP: 109/59 93/47 97/47  106/66  Pulse: 65 57 60 71  Temp:      TempSrc:      Resp: 20  20   Height:      Weight:      SpO2:         FHT:  FHR: 120 bpm, variability: moderate,  accelerations:  Present,  decelerations:  Present small variable w/ repositioning UC:   regular, every 2.5-3.5 minutes, MVU 125 SVE:   Dilation: 7 Effacement (%): 90 Station: -2 Exam by:: Malikai Gut IUPC and foley cath placed.  A / P: Protracted labor, inadequate contractile strength Pitocin augmentation,  titrate to MVU>200  Fetal Wellbeing:  Category I Pain Control:  Epidural  Anticipated MOD:  NSVD if able to facilitate fetal descent and rotation, exaggerated lateral postioning, rotate q 30-60 min.  Halcyon Heck 09/29/2011, 11:39 AM

## 2011-09-29 NOTE — Progress Notes (Signed)
S: Increased pressure, tired, desires exam.   O: Filed Vitals:   09/29/11 2130 09/29/11 2200 09/29/11 2230 09/29/11 2300  BP: 115/75 119/69 121/61 123/79  Pulse: 96 73 71 73  Temp:  98.1 F (36.7 C) 98.1 F (36.7 C)   TempSrc:  Oral Axillary   Resp: 18 20 18    Height:      Weight:      SpO2:         FHT:  FHR: 130 bpm, variability: moderate,  accelerations:  Present,  decelerations:  Absent UC:   IUPC q , MVU 150-170 SVE:   Dilation: 8 Effacement (%): 90 Station: -1/ caput at 0 station Exam by:: Chelcee Korpi,CNM Direct OP, no rotation despite repeated repositioning and attempted manual rotation.  A / P: Arrest in active phase of labor  Fetal Wellbeing:  Category I Pain Control:  Epidural  Anticipated MOD:  Dr Seymour Bars called for CS  Preop started for CS  Discussed with patient - inadequate/adequate ctx / vtx at -1 station with increasing caput of fetal head  And persistent OP presentation. Recommend CS delivery - suspect CPD.  Reviewed guidance for CS / postoperative care / risks of surgery : trauma to nearby organs, bleeding, infection, with significant bleeding risk of possible blood transfusion.  Understands - desires to proceed with C/S, feels like she can not do anymore labor.     Averie Meiner 09/29/2011, 11:10 PM

## 2011-09-29 NOTE — Progress Notes (Signed)
[name], CNM called to check on patient uterine activity.  New order received to hold off on placing 2nd cytotec until contractions become more spaced.

## 2011-09-29 NOTE — Anesthesia Procedure Notes (Signed)
Epidural Patient location during procedure: OB Start time: 09/29/2011 9:25 AM  Staffing Anesthesiologist: Brayton Caves R Performed by: anesthesiologist   Preanesthetic Checklist Completed: patient identified, site marked, surgical consent, pre-op evaluation, timeout performed, IV checked, risks and benefits discussed and monitors and equipment checked  Epidural Patient position: sitting Prep: site prepped and draped and DuraPrep Patient monitoring: continuous pulse ox and blood pressure Approach: midline Injection technique: LOR air and LOR saline  Needle:  Needle type: Tuohy  Needle gauge: 17 G Needle length: 9 cm Needle insertion depth: 5 cm cm Catheter type: closed end flexible Catheter size: 19 Gauge Catheter at skin depth: 10 cm Test dose: negative  Assessment Events: blood not aspirated, injection not painful, no injection resistance, negative IV test and no paresthesia  Additional Notes Patient identified.  Risk benefits discussed including failed block, incomplete pain control, headache, nerve damage, paralysis, blood pressure changes, nausea, vomiting, reactions to medication both toxic or allergic, and postpartum back pain.  Patient expressed understanding and wished to proceed.  All questions were answered.  Sterile technique used throughout procedure and epidural site dressed with sterile barrier dressing. No paresthesia or other complications noted.The patient did not experience any signs of intravascular injection such as tinnitus or metallic taste in mouth nor signs of intrathecal spread such as rapid motor block. Please see nursing notes for vital signs.

## 2011-09-30 ENCOUNTER — Encounter (HOSPITAL_COMMUNITY): Payer: Self-pay

## 2011-09-30 ENCOUNTER — Inpatient Hospital Stay (HOSPITAL_COMMUNITY): Admission: AD | Admit: 2011-09-30 | Payer: Self-pay | Source: Ambulatory Visit | Admitting: Obstetrics and Gynecology

## 2011-09-30 LAB — CBC
HCT: 30.2 % — ABNORMAL LOW (ref 36.0–46.0)
Hemoglobin: 10.3 g/dL — ABNORMAL LOW (ref 12.0–15.0)
RBC: 3.26 MIL/uL — ABNORMAL LOW (ref 3.87–5.11)

## 2011-09-30 MED ORDER — KETOROLAC TROMETHAMINE 60 MG/2ML IM SOLN
60.0000 mg | Freq: Once | INTRAMUSCULAR | Status: AC | PRN
  Administered 2011-09-30: 60 mg via INTRAMUSCULAR

## 2011-09-30 MED ORDER — MENTHOL 3 MG MT LOZG
1.0000 | LOZENGE | OROMUCOSAL | Status: DC | PRN
  Filled 2011-09-30: qty 9

## 2011-09-30 MED ORDER — DIPHENHYDRAMINE HCL 25 MG PO CAPS
25.0000 mg | ORAL_CAPSULE | ORAL | Status: DC | PRN
  Administered 2011-09-30: 25 mg via ORAL

## 2011-09-30 MED ORDER — PRENATAL MULTIVITAMIN CH
1.0000 | ORAL_TABLET | Freq: Every day | ORAL | Status: DC
  Administered 2011-09-30 – 2011-10-03 (×4): 1 via ORAL
  Filled 2011-09-30 (×5): qty 1

## 2011-09-30 MED ORDER — DIPHENHYDRAMINE HCL 25 MG PO CAPS
25.0000 mg | ORAL_CAPSULE | Freq: Four times a day (QID) | ORAL | Status: DC | PRN
  Filled 2011-09-30 (×2): qty 1

## 2011-09-30 MED ORDER — LACTATED RINGERS IV SOLN
INTRAVENOUS | Status: DC
  Administered 2011-09-30: 12:00:00 via INTRAVENOUS

## 2011-09-30 MED ORDER — HYDROMORPHONE HCL PF 1 MG/ML IJ SOLN: 0.2500 mg | INTRAMUSCULAR | Status: DC | PRN

## 2011-09-30 MED ORDER — ONDANSETRON HCL 4 MG/2ML IJ SOLN: 4.0000 mg | INTRAMUSCULAR | Status: DC | PRN

## 2011-09-30 MED ORDER — OXYCODONE-ACETAMINOPHEN 5-325 MG PO TABS
1.0000 | ORAL_TABLET | ORAL | Status: DC | PRN
  Administered 2011-09-30 – 2011-10-01 (×3): 2 via ORAL
  Administered 2011-10-01: 1 via ORAL
  Administered 2011-10-01 (×2): 2 via ORAL
  Administered 2011-10-02 – 2011-10-03 (×9): 1 via ORAL
  Filled 2011-09-30: qty 2
  Filled 2011-09-30: qty 1
  Filled 2011-09-30: qty 2
  Filled 2011-09-30: qty 1
  Filled 2011-09-30: qty 2
  Filled 2011-09-30 (×7): qty 1
  Filled 2011-09-30 (×2): qty 2
  Filled 2011-09-30: qty 1

## 2011-09-30 MED ORDER — SODIUM CHLORIDE 0.9 % IV SOLN
1.0000 ug/kg/h | INTRAVENOUS | Status: DC | PRN
  Filled 2011-09-30: qty 2.5

## 2011-09-30 MED ORDER — NALOXONE HCL 0.4 MG/ML IJ SOLN: 0.4000 mg | INTRAMUSCULAR | Status: DC | PRN

## 2011-09-30 MED ORDER — MORPHINE SULFATE (PF) 0.5 MG/ML IJ SOLN
INTRAMUSCULAR | Status: DC | PRN
  Administered 2011-09-30: 4 ug via EPIDURAL

## 2011-09-30 MED ORDER — OXYTOCIN 20 UNITS IN LACTATED RINGERS INFUSION - SIMPLE: 125.0000 mL/h | INTRAVENOUS | Status: AC

## 2011-09-30 MED ORDER — DIBUCAINE 1 % RE OINT
1.0000 "application " | TOPICAL_OINTMENT | RECTAL | Status: DC | PRN
  Filled 2011-09-30: qty 28

## 2011-09-30 MED ORDER — OXYTOCIN 10 UNIT/ML IJ SOLN
INTRAMUSCULAR | Status: AC
  Filled 2011-09-30: qty 6

## 2011-09-30 MED ORDER — KETOROLAC TROMETHAMINE 60 MG/2ML IM SOLN
INTRAMUSCULAR | Status: AC
  Filled 2011-09-30: qty 2

## 2011-09-30 MED ORDER — OXYTOCIN 20 UNITS IN LACTATED RINGERS INFUSION - SIMPLE
INTRAVENOUS | Status: DC | PRN
  Administered 2011-09-30: 40 [IU] via INTRAVENOUS
  Administered 2011-09-30: 30 [IU] via INTRAVENOUS

## 2011-09-30 MED ORDER — METHYLERGONOVINE MALEATE 0.2 MG/ML IJ SOLN
INTRAMUSCULAR | Status: DC | PRN
  Administered 2011-09-30: 0.2 mg via INTRAMUSCULAR

## 2011-09-30 MED ORDER — ENOXAPARIN SODIUM 100 MG/ML ~~LOC~~ SOLN
100.0000 mg | SUBCUTANEOUS | Status: DC
  Administered 2011-10-01 – 2011-10-03 (×3): 100 mg via SUBCUTANEOUS
  Filled 2011-09-30 (×4): qty 1

## 2011-09-30 MED ORDER — SCOPOLAMINE 1 MG/3DAYS TD PT72
1.0000 | MEDICATED_PATCH | Freq: Once | TRANSDERMAL | Status: AC
  Administered 2011-09-30: 1.5 mg via TRANSDERMAL

## 2011-09-30 MED ORDER — MEPERIDINE HCL 25 MG/ML IJ SOLN
INTRAMUSCULAR | Status: DC | PRN
  Administered 2011-09-30 (×2): 12.5 mg via INTRAVENOUS

## 2011-09-30 MED ORDER — METOCLOPRAMIDE HCL 5 MG/ML IJ SOLN: 10.0000 mg | Freq: Three times a day (TID) | INTRAMUSCULAR | Status: DC | PRN

## 2011-09-30 MED ORDER — SODIUM BICARBONATE 8.4 % IV SOLN
INTRAVENOUS | Status: DC | PRN
  Administered 2011-09-30: 5 mL via EPIDURAL

## 2011-09-30 MED ORDER — NALBUPHINE HCL 10 MG/ML IJ SOLN
5.0000 mg | INTRAMUSCULAR | Status: DC | PRN
  Administered 2011-09-30: 5 mg via SUBCUTANEOUS
  Filled 2011-09-30 (×2): qty 1

## 2011-09-30 MED ORDER — ONDANSETRON HCL 4 MG PO TABS: 4.0000 mg | ORAL_TABLET | ORAL | Status: DC | PRN

## 2011-09-30 MED ORDER — MEPERIDINE HCL 25 MG/ML IJ SOLN: 6.2500 mg | INTRAMUSCULAR | Status: DC | PRN

## 2011-09-30 MED ORDER — IBUPROFEN 600 MG PO TABS
600.0000 mg | ORAL_TABLET | Freq: Four times a day (QID) | ORAL | Status: DC | PRN
  Filled 2011-09-30: qty 1

## 2011-09-30 MED ORDER — DIPHENHYDRAMINE HCL 50 MG/ML IJ SOLN: 12.5000 mg | INTRAMUSCULAR | Status: DC | PRN

## 2011-09-30 MED ORDER — ONDANSETRON HCL 4 MG/2ML IJ SOLN: 4.0000 mg | Freq: Three times a day (TID) | INTRAMUSCULAR | Status: DC | PRN

## 2011-09-30 MED ORDER — SIMETHICONE 80 MG PO CHEW
80.0000 mg | CHEWABLE_TABLET | Freq: Three times a day (TID) | ORAL | Status: DC
  Administered 2011-09-30 – 2011-10-03 (×10): 80 mg via ORAL

## 2011-09-30 MED ORDER — IBUPROFEN 600 MG PO TABS
600.0000 mg | ORAL_TABLET | Freq: Four times a day (QID) | ORAL | Status: DC
  Administered 2011-09-30 – 2011-10-02 (×7): 600 mg via ORAL
  Filled 2011-09-30 (×7): qty 1

## 2011-09-30 MED ORDER — ONDANSETRON HCL 4 MG/2ML IJ SOLN
INTRAMUSCULAR | Status: DC | PRN
  Administered 2011-09-30: 4 mg via INTRAVENOUS

## 2011-09-30 MED ORDER — TETANUS-DIPHTH-ACELL PERTUSSIS 5-2.5-18.5 LF-MCG/0.5 IM SUSP: 0.5000 mL | Freq: Once | INTRAMUSCULAR | Status: DC

## 2011-09-30 MED ORDER — MEPERIDINE HCL 25 MG/ML IJ SOLN
INTRAMUSCULAR | Status: AC
  Filled 2011-09-30: qty 1

## 2011-09-30 MED ORDER — KETOROLAC TROMETHAMINE 30 MG/ML IJ SOLN: 30.0000 mg | Freq: Four times a day (QID) | INTRAMUSCULAR | Status: DC | PRN

## 2011-09-30 MED ORDER — WITCH HAZEL-GLYCERIN EX PADS: 1.0000 "application " | MEDICATED_PAD | CUTANEOUS | Status: DC | PRN

## 2011-09-30 MED ORDER — BUPIVACAINE HCL (PF) 0.25 % IJ SOLN
INTRAMUSCULAR | Status: DC | PRN
  Administered 2011-09-30: 10 mL

## 2011-09-30 MED ORDER — MAGNESIUM HYDROXIDE 400 MG/5ML PO SUSP
30.0000 mL | ORAL | Status: DC | PRN
  Administered 2011-10-02: 30 mL via ORAL
  Filled 2011-09-30 (×2): qty 30

## 2011-09-30 MED ORDER — KETOROLAC TROMETHAMINE 30 MG/ML IJ SOLN
30.0000 mg | Freq: Four times a day (QID) | INTRAMUSCULAR | Status: DC | PRN
  Administered 2011-09-30: 30 mg via INTRAVENOUS
  Filled 2011-09-30: qty 1

## 2011-09-30 MED ORDER — SIMETHICONE 80 MG PO CHEW
80.0000 mg | CHEWABLE_TABLET | ORAL | Status: DC | PRN
  Administered 2011-09-30: 80 mg via ORAL

## 2011-09-30 MED ORDER — LANOLIN HYDROUS EX OINT: 1.0000 "application " | TOPICAL_OINTMENT | CUTANEOUS | Status: DC | PRN

## 2011-09-30 MED ORDER — OXYTOCIN 10 UNIT/ML IJ SOLN
INTRAMUSCULAR | Status: AC
  Filled 2011-09-30: qty 1

## 2011-09-30 MED ORDER — SCOPOLAMINE 1 MG/3DAYS TD PT72
MEDICATED_PATCH | TRANSDERMAL | Status: AC
  Filled 2011-09-30: qty 1

## 2011-09-30 MED ORDER — NALBUPHINE HCL 10 MG/ML IJ SOLN
5.0000 mg | INTRAMUSCULAR | Status: DC | PRN
  Administered 2011-09-30: 5 mg via INTRAVENOUS
  Filled 2011-09-30 (×2): qty 1

## 2011-09-30 MED ORDER — ZOLPIDEM TARTRATE 5 MG PO TABS: 5.0000 mg | ORAL_TABLET | Freq: Every evening | ORAL | Status: DC | PRN

## 2011-09-30 MED ORDER — SODIUM CHLORIDE 0.9 % IJ SOLN: 3.0000 mL | INTRAMUSCULAR | Status: DC | PRN

## 2011-09-30 MED ORDER — DIPHENHYDRAMINE HCL 50 MG/ML IJ SOLN: 25.0000 mg | INTRAMUSCULAR | Status: DC | PRN

## 2011-09-30 MED ORDER — SENNOSIDES-DOCUSATE SODIUM 8.6-50 MG PO TABS
2.0000 | ORAL_TABLET | Freq: Every day | ORAL | Status: DC
  Administered 2011-09-30 – 2011-10-02 (×3): 2 via ORAL

## 2011-09-30 MED ORDER — FERROUS SULFATE 325 (65 FE) MG PO TABS
325.0000 mg | ORAL_TABLET | Freq: Two times a day (BID) | ORAL | Status: DC
  Administered 2011-09-30 – 2011-10-03 (×6): 325 mg via ORAL
  Filled 2011-09-30 (×8): qty 1

## 2011-09-30 NOTE — Progress Notes (Signed)
POSTOPERATIVE DAY # 0 S/P cesarean section/ Leiden V carrier   S:         Reports feeling really tired - "exhausted" / +itching intense             Tolerating po intake / no nausea / no vomiting / no flatus / no BM             Bleeding is light             Pain controlled with narcotic med             Up ad lib / ambulatory  Newborn breast feeding  / female newborn   O: A & O x 3 NAD             VS: Blood pressure 112/71, pulse 62, temperature 98.7 F (37.1 C), temperature source Oral, resp. rate 18, height 5\' 7"  (1.702 m), weight 86.183 kg (190 lb), last menstrual period 12/24/2010, SpO2 97.00%, unknown if currently breastfeeding.  LABS: WBC/Hgb/Hct/Plts:  20.8/10.3/30.2/121 (05/25 4540)   Lungs: Clear and unlabored  Heart: regular rate and rhythm / no mumurs  Abdomen: soft, non-tender, mildly distended, hypoactive BS             Fundus: firm, non-tender, Ueven             Dressing intact              Perineum: mild edema  Lochia: light  Extremities: no edema, no calf pain or tenderness, SCD in place  A:        POD # 0 S/P cesarean section            Leiden V carrier  P:        Routine postoperative care              DVT prophylaxis - SCD / restart lovenox as ordered             CBC tomorrow     [name] 09/30/2011, 10:35 AM

## 2011-09-30 NOTE — Anesthesia Postprocedure Evaluation (Signed)
  Anesthesia Post-op Note  Patient: Amanda Bean  Procedure(s) Performed: Procedure(s) (LRB): CESAREAN SECTION (N/A)  Patient is awake, responsive, moving her legs, and has signs of resolution of her numbness. Pain and nausea are reasonably well controlled. Vital signs are stable and clinically acceptable. Oxygen saturation is clinically acceptable. There are no apparent anesthetic complications at this time. Patient is ready for discharge.

## 2011-09-30 NOTE — Anesthesia Postprocedure Evaluation (Signed)
  Anesthesia Post-op Note  Patient: Amanda Bean  Procedure(s) Performed: Procedure(s) (LRB): CESAREAN SECTION (N/A)  Patient Location: Mother/Baby  Anesthesia Type: Epidural  Level of Consciousness: awake, alert  and oriented  Airway and Oxygen Therapy: Patient Spontanous Breathing  Post-op Pain: mild  Post-op Assessment: Patient's Cardiovascular Status Stable, Respiratory Function Stable, Patent Airway, No signs of Nausea or vomiting and Pain level controlled  Post-op Vital Signs: stable  Complications: No apparent anesthesia complications

## 2011-09-30 NOTE — Transfer of Care (Signed)
Immediate Anesthesia Transfer of Care Note  Patient: Amanda Bean  Procedure(s) Performed: Procedure(s) (LRB): CESAREAN SECTION (N/A)  Patient Location: PACU  Anesthesia Type: Epidural  Level of Consciousness: awake, alert , oriented and patient cooperative  Airway & Oxygen Therapy: Patient Spontanous Breathing  Post-op Assessment: Report given to PACU RN and Post -op Vital signs reviewed and stable  Post vital signs: Reviewed and stable  Complications: No apparent anesthesia complications

## 2011-09-30 NOTE — Addendum Note (Signed)
Addendum  created 09/30/11 0901 by Lincoln Brigham, CRNA   Modules edited:Charges VN, Notes Section

## 2011-10-01 NOTE — Progress Notes (Signed)
POSTOPERATIVE DAY # 1 S/P cesarean section   S:         Reports feeling well - better than yesterday             Tolerating po intake / no nausea / no vomiting / + flatus / no BM             Bleeding is light             Pain controlled with percocet             Up ad lib / ambulatory  Newborn breast-feeding using nipple shields  / female    O:  A & O x 3 NAD             VS: Blood pressure 99/46, pulse 63, temperature 98 F (36.7 C), temperature source Oral, resp. rate 18, height 5\' 7"  (1.702 m), weight 86.183 kg (190 lb), last menstrual period 12/24/2010, SpO2 98.00%, unknown if currently breastfeeding.  I&O:   -445  Lungs: Clear and unlabored  Heart: regular rate and rhythm / no mumurs  Abdomen: soft, non-tender, non-distended, active BS             Fundus: firm, non-tender, U-1             Dressing OFF              Incision:  approximated with staples / no erythema / no ecchymosis / no drainage  Perineum: no edema  Lochia: light  Extremities:  Trace edema, no calf pain or tenderness, negative Homans  A:        POD # 1 S/P cesarean section            Leiden V carrier  P:        Routine postoperative care              Lovenox postpartum - reinitiated today             Consider early discharge tomorrow     [name] 10/01/2011, 10:33 AM

## 2011-10-02 ENCOUNTER — Encounter (HOSPITAL_COMMUNITY): Payer: Self-pay | Admitting: Obstetrics & Gynecology

## 2011-10-02 NOTE — Progress Notes (Signed)
POSTOPERATIVE DAY # 2 S/P cesarean   S:         Reports feeling sore and tired             Tolerating po intake / no nausea / no vomiting / + flatus / no BM             Bleeding is light             Pain controlled withnarcotic analgesics including  percocet             Up ad lib / ambulatory / voiding QS  Newborn breast feeding     O:  A & O x 3              VS: Blood pressure 109/69, pulse 64, temperature 98.4 F (36.9 C), temperature source Oral, resp. rate 20, height 5\' 7"  (1.702 m), weight 190 lb (86.183 kg), last menstrual period 12/24/2010, SpO2 98.00%, unknown if currently breastfeeding.  LABS:     I&O:     Lungs: Clear and unlabored  Heart: regular rate and rhythm / no mumurs  Abdomen: soft, non-tender, non-distended, bowel sounds active             Fundus: firm, non-tender, U-1             Dressing OFF              Incision:  approximated with staples / no erythema / no ecchymosis / no drainage  Perineum: no edema  Lochia: light  Extremities: no edema, no calf pain or tenderness, negative Homans  A:        POD # 2 S/P cesarean section            Leiden V carrier on Lovenox  P:        Routine postoperative care              Anticipate discharge tomorrow     Amanda Bean 10/02/2011, 9:43 AM

## 2011-10-03 MED ORDER — OXYCODONE-ACETAMINOPHEN 5-325 MG PO TABS: 1.0000 | ORAL_TABLET | ORAL | Status: AC | PRN

## 2011-10-03 NOTE — Progress Notes (Signed)
Patient ID: [name], female   DOB: [birth year], 31 y.o.   MRN: [id number]  POSTOPERATIVE DAY # 3 S/P cesarean section   S:         Reports feeling sore and stiff / tired             Tolerating po intake / no nausea / no vomiting / + flatus / no BM             Bleeding is light             Pain controlled withpercocet             Up ad lib / ambulatory  Newborn breast feeding     O:  A & O x 3              VS: Blood pressure 119/73, pulse 67, temperature 98.3 F (36.8 C), temperature source Oral, resp. rate 18, height 5\' 7"  (1.702 m), weight 86.183 kg (190 lb), last menstrual period 12/24/2010, SpO2 97.00%, unknown if currently breastfeeding.  Lungs: Clear and unlabored  Heart: regular rate and rhythm / no mumurs  Abdomen: soft, non-tender, non-distended              Fundus: firm, non-tender, U-1             Dressing OFF              Incision:  approximated with staples / no erythema / small ecchymosis / dried drainage  Perineum: no edema  Lochia: light  Extremities: trace edema, no calf pain or tenderness, negative Homans / TEDs in place  A:        POD # 3 S/P cesarean section            Amanda Bean carrier on lovenox  P:        Routine postoperative care              Discharge to home             Staple removal at [facility] this week     [name] 10/03/2011, 9:54 AM

## 2011-10-03 NOTE — Discharge Summary (Signed)
POSTOPERATIVE DISCHARGE SUMMARY:  Patient ID: Amanda Bean Amanda Bean MRN: [id number] DOB/AGE: 04-Dec-1980 31 y.o.  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 Factor V Leiden mutation   . Anemia   . Dysrhythmia     palpitations  . Unspecified sinusitis (chronic)   . History of chicken pox   . MTHFR deficiency complicating pregnancy, unspecified trimester   . Factor V Leiden mutation complicating pregnancy 09/28/2011    Past surgical history:  Past Surgical History  Procedure Date  . Wisdom tooth extraction   . Cesarean section 09/29/2011    Procedure: CESAREAN SECTION;  Surgeon: Genia Del, MD;  Location: [facility];  Service: Gynecology;  Laterality: N/A;    Family History:  Family History  Problem Relation Age of Onset  . Thyroid disease Mother   . Other Mother     hemoglobinopathy  . Factor V Leiden deficiency Mother   . Alcohol abuse Brother   . Thyroid disease Maternal Aunt   . Other Maternal Aunt     hemoglobinopathy  . Factor V Leiden deficiency Maternal Aunt   . Thyroid disease Maternal Grandmother   . Stroke Maternal Grandmother   . Depression Maternal Grandmother   . Anesthesia problems  Cousin     allergic to anesthesia    Social History:  reports that she quit smoking about 9 years ago. She has never used smokeless tobacco. She reports that she does not drink alcohol or use illicit drugs.   Allergies: Latex    Current Medications at time of admission:  Prior to Admission medications   Medication Sig Start Date End Date Taking? Authorizing Provider  enoxaparin (LOVENOX) 100 MG/ML SOLN Inject 1 mL (100 mg total) into the skin daily. 03/24/11  Yes Victorino December, MD  ferrous fumarate (HEMOCYTE - 106 MG FE) 325 (106 FE) MG TABS Take 1 tablet by mouth.   Yes Historical Provider, MD  Prenatal MV-Min-Fe Fum-FA-DHA (PRENATAL 1 PO) Take 1 each by mouth daily.      Historical Provider, MD      Intrapartum Course: admit for induction of labor / cervical ballon with cytotec / pitocin augmentation after AROM / epidural for pain management / arrest of active labor with suspicion for CPD.  Procedures: Cesarean section delivery of female newborn by Dr Seymour Bars  See operative report for further details  Postoperative / postpartum course: uneventful - transition back to lovenon at 24 hours post-op  Physical Exam:   VSS: Blood pressure 119/73, pulse 67, temperature 98.3 F (36.8 C), temperature source Oral, resp. rate 18, height 5\' 7"  (1.702 m), weight 86.183 kg (190 lb), last menstrual period 12/24/2010, SpO2 97.00%, unknown if currently breastfeeding.  LABS:   preop  hgb 11.9 / postop hgb 10.3 / plt nadir 121 General: NAD / fatigued Heart:RR Lungs: clear and unlabored Abdomen: non-distended / non-tender Extremities: no edema / no evidence of DVT - negative Homans and no calf pain   Incision:  approximated with staples / no erythema / small ecchymosis / scant dried drainage Staples: intact for removal day 7  Discharge Instructions:  Discharged Condition: stable Activity: pelvic rest Diet: routine Medications: see below Medication List  As of 10/03/2011 10:01 AM   STOP  taking these medications         acetaminophen 325 MG suppository      Heparin Sodium (Porcine) PF 5000 UNIT/0.5ML Soln         TAKE these medications         enoxaparin 100 MG/ML injection   Commonly known as: LOVENOX   Inject 1 mL (100 mg total) into the skin daily.      ferrous fumarate 325 (106 FE) MG Tabs   Commonly known as: HEMOCYTE - 106 mg FE   Take 1 tablet by mouth.      oxyCODONE-acetaminophen 5-325 MG per tablet   Commonly known as: PERCOCET   Take 1-2 tablets by mouth every 3 (three) hours as needed (moderate - severe pain).      PRENATAL 1 PO   Take 1 each by mouth daily.           Condition: stable Postpartum Instructions: refer to practice specific booklet Discharge to: home Disposition: Final discharge disposition not confirmed Follow up :  Follow-up Information    Follow up with [facility]. Schedule an appointment as soon as possible for a visit in 3 days. (staple removal)           Signed: [name] 10/03/2011, 10:01 AM

## 2011-10-05 NOTE — Progress Notes (Signed)
Post discharge chart review completed.  

## 2011-10-09 ENCOUNTER — Ambulatory Visit (HOSPITAL_COMMUNITY)
Admission: RE | Admit: 2011-10-09 | Discharge: 2011-10-09 | Disposition: A | Discharge status: Home / Self Care | Payer: BC Managed Care – PPO | Source: Ambulatory Visit | Attending: Obstetrics and Gynecology | Admitting: Obstetrics and Gynecology

## 2011-10-09 NOTE — Progress Notes (Addendum)
Infant Lactation Consultation Outpatient Visit Note  Patient Name: Amanda Bean Date of Birth: [birth year] Birth Weight: 8-1.8oz  Gestational Age at Delivery:  Type of Delivery: CS  Breastfeeding History Frequency of Breastfeeding: Q 2-3 hours Length of Feeding: 1 hour Voids: QS Stools: QS  Supplementing / Method: Pumping:  Type of Pump:Medela PIS   Frequency: Q 3 hours  Volume:  2 1/2 -3 oz  Comments:  Mom reports that baby feeds better during the daytime rather than at night. At night she goes off to sleep after a few minutes and then doesn't get a whole feeding. Parents have been bottle feeding EBM after feedings.  Consultation Evaluation:  Initial Feeding Assessment: Pre-feed Weight:7 -14.5 oz 3588g Post-feed Weight:7-14.8  3596g Amount Transferred:8cc Comments: Baby nursed for 20 minutes with NS. Few swallows heard. Baby needed much stimulation to keep nursing.   Additional Feeding Assessment: Pre-feed Weight:7- 14.8oz  3596g Post-feed Weight:7-15   3600g Amount Transferred: 4 ccs Comments:Baby latched to right breast without NS and nursed for 15 minutes. Again few swallows heard. Diaper changed.  Additional Feeding Assessment: Pre-feed Weight:7-13.9oz 3568g Post-feed Weight: 7-15.1  3604g Amount Transferred:36cc Comments:Baby nursed for 20 minutes on left breast. Would not latch without NS. A few more swallows heard and Mom reports that baby always nurses better on this breast. Did feel like breast softened somewhat.  Total Breast milk Transferred this Visit: 48cc's Total Supplement Given: Mom does not have EBM here with her so will supplement when they get home.  Additional Interventions: Baby Is only getting EBM at this time, no formula. Parents concerned that feedings are taking so long and Mom is not getting much rest at night. Encouraged to limit feedings to 15- 20 minutes per side and try to keep baby actively sucking and swallowing during that time. To  continue trying baby at breast without NS. Mom pleased that baby was able to latch to right side without NS- felt several good tugs.  Follow-Up Parents do not want to make another appointment here at this time. To call prn/or to make another appointment if needed.     Pamelia Hoit 10/09/2011, 11:53 AM

## 2011-10-13 ENCOUNTER — Telehealth: Payer: Self-pay | Admitting: Medical Oncology

## 2011-10-13 NOTE — Telephone Encounter (Signed)
Received call from patient "I am due to pick up my Lovenox on Monday and need to know if the dose needs to be changed?  I had lab work drawn on Friday at my OBGYN office and they were going to fax the results to Dr. Welton Flakes."  Had [facility] re-fax results.  Will review with MD.  Per MD patient is therapeutic and should remain on current Lovenox dose.  Patient expressed understanding, no further questions at this time

## 2013-12-30 LAB — OB RESULTS CONSOLE RPR: RPR: NONREACTIVE

## 2013-12-30 LAB — OB RESULTS CONSOLE HIV ANTIBODY (ROUTINE TESTING): HIV: NONREACTIVE

## 2013-12-30 LAB — OB RESULTS CONSOLE GC/CHLAMYDIA: Gonorrhea: NEGATIVE

## 2014-02-10 ENCOUNTER — Other Ambulatory Visit (HOSPITAL_COMMUNITY): Payer: Self-pay | Admitting: Obstetrics & Gynecology

## 2014-02-10 DIAGNOSIS — Z3689 Encounter for other specified antenatal screening: Secondary | ICD-10-CM

## 2014-02-10 DIAGNOSIS — R772 Abnormality of alphafetoprotein: Secondary | ICD-10-CM

## 2014-02-17 ENCOUNTER — Ambulatory Visit (HOSPITAL_COMMUNITY)
Admission: RE | Admit: 2014-02-17 | Discharge: 2014-02-17 | Disposition: A | Discharge status: Home / Self Care | Payer: BC Managed Care – PPO | Source: Ambulatory Visit | Attending: Obstetrics & Gynecology | Admitting: Obstetrics & Gynecology

## 2014-02-17 ENCOUNTER — Encounter (HOSPITAL_COMMUNITY): Payer: Self-pay

## 2014-02-17 VITALS — BP 108/53 | HR 71 | Wt 168.0 lb

## 2014-02-17 DIAGNOSIS — Z36 Encounter for antenatal screening of mother: Secondary | ICD-10-CM | POA: Diagnosis not present

## 2014-02-17 DIAGNOSIS — Z3A17 17 weeks gestation of pregnancy: Secondary | ICD-10-CM

## 2014-02-17 DIAGNOSIS — R772 Abnormality of alphafetoprotein: Secondary | ICD-10-CM | POA: Insufficient documentation

## 2014-02-17 DIAGNOSIS — O289 Unspecified abnormal findings on antenatal screening of mother: Secondary | ICD-10-CM | POA: Insufficient documentation

## 2014-02-17 DIAGNOSIS — Z3689 Encounter for other specified antenatal screening: Secondary | ICD-10-CM

## 2014-02-17 DIAGNOSIS — Z315 Encounter for genetic counseling: Secondary | ICD-10-CM | POA: Insufficient documentation

## 2014-02-17 DIAGNOSIS — O358XX0 Maternal care for other (suspected) fetal abnormality and damage, not applicable or unspecified: Secondary | ICD-10-CM | POA: Insufficient documentation

## 2014-02-17 DIAGNOSIS — O280 Abnormal hematological finding on antenatal screening of mother: Secondary | ICD-10-CM

## 2014-02-19 ENCOUNTER — Encounter (HOSPITAL_COMMUNITY): Payer: BC Managed Care – PPO

## 2014-02-19 ENCOUNTER — Other Ambulatory Visit (HOSPITAL_COMMUNITY): Payer: BC Managed Care – PPO

## 2014-02-19 NOTE — Progress Notes (Addendum)
Genetic Counseling  High-Risk Gestation Note  Appointment Date:  02/17/2014 Referred By: [name], MD Date of Birth:  11/21/1980 Partner: Onalee Hua    Pregnancy History: G3T5176 Estimated Date of Delivery: 07/24/14 Estimated Gestational Age: [redacted]w[redacted]d Attending: Renella Cunas, MD    Amanda Bean and her partner, Mr. Shai Rasmussen, were seen for consultation for genetic counseling because of an elevated MSAFP of 2.55 MoMs based on maternal serum screening through [place] Intern, Santiago Bumpers, assisted with genetic counseling under my direct supervision.   We reviewed Amanda Bean's maternal serum screening result, the elevation of MSAFP, and the associated 1 in 262 risk for a fetal open neural tube defect.   We reviewed ONTDs, the typical multifactorial etiology, and variable prognosis.  In addition, we discussed additional explanations for an elevated MSAFP including: normal variation, twins, feto-maternal bleeding, a gestational dating error, abdominal wall defects, kidney differences, oligohydramnios, and placental problems.  We discussed that an unexplained elevation of MSAFP is associated with an increased risk for third trimester complications including: prematurity, low birth weight, and pre-eclampsia.    Detailed ultrasound was performed at the time of today's visit. Visualized fetal spine, abdominal wall, and posterior fossa were visualized and appeared normal. Left pyelectasis was visualized at this time (4.7 mm). Complete ultrasound results reported separately.   We discussed that fetal pyelectasis is defined as the dilatation of the fetal renal pelvis/pelvises due to excess urine. This finding is estimated to occur in 2-3% of fetuses.  The female to female ratio is 2:1.  Typically, babies with mild pyelectasis are born normal and healthy and we are usually unable to determine why this extra fluid is present.  This urine accumulation may regress,  stay the same or continue to accumulate.  The more fluid that accumulates, the more likely this fluid could be the result of a compromise in kidney function, an obstruction, or narrowing of the ureters which transport urine out of the body, thus causing backflow of fluid into the kidneys.  Therefore, it is important to follow pyelectasis to make sure it does not become more concerning.  Also, in some cases postnatal evaluation of baby's kidneys may be warranted.  We discussed that the finding of pyelectasis is associated with an increased risk for fetal aneuploidy.  This risk is highest when other anomalies or fetal differences are visualized.  We reviewed additional available screening and diagnostic options including detailed ultrasound and amniocentesis.  We discussed the risks, limitations, and benefits of each.  After thoughtful consideration of these options, Amanda Bean declined additional screening and testing in the pregnancy aside from ultrasound. She declined amniocentesis.  She understands that ultrasound cannot rule out all birth defects or genetic syndromes.  However, she was counseled that ~90% of fetuses with ONTDs can be detected by detailed 2nd trimester ultrasound, when well visualized.    Amanda Bean was provided with written information regarding cystic fibrosis (CF) including the carrier frequency and incidence in the Caucasian population, the availability of carrier testing and prenatal diagnosis if indicated.  In addition, we discussed that CF is routinely screened for as part of the [place] newborn screening panel.  She declined testing today.   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 They reported that she has had surgical correction through [facility]. She is 33 years old and is otherwise healthy. 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 There are different forms of craniosynostosis, including isolated forms and craniosynostoses that are part of an underlying condition. Isolated craniosynostosis is often sporadically occuring. For genetic conditions involving craniosynostosis, the majority display autosomal dominant inheritance, and may be inherited or due to de novo mutations. In the case of apparently isolated craniosynostosis, recurrence risk for siblings is estimated to be 5% for coronal craniosynostosis and approximately 1% for sagittal suture fusion. Additional information regarding their daughter's craniosynostosis may alter recurrence risk assessment. Without further information regarding the provided family history, an accurate genetic risk cannot be calculated. Further genetic counseling is warranted if more information is obtained.  Amanda Bean denied exposure to environmental toxins or chemical agents. She denied the use of alcohol, tobacco or street drugs. She denied significant viral illnesses during the course of her pregnancy. Her medical and surgical histories were contributory for history of Factor V Leiden heterozygosity, and she reported taking a baby aspirin during the pregnancy. She reported that this is being managed by her OB.   I counseled this couple for approximately 30 minutes regarding the above risks and available options.    Chipper Oman, MS,  Certified Genetic Counselor  02/19/2014

## 2014-03-09 ENCOUNTER — Encounter (HOSPITAL_COMMUNITY): Payer: Self-pay

## 2014-06-04 LAB — OB RESULTS CONSOLE GBS: STREP GROUP B AG: NEGATIVE

## 2014-07-28 ENCOUNTER — Inpatient Hospital Stay (HOSPITAL_COMMUNITY): Payer: Medicaid Other | Admitting: Anesthesiology

## 2014-07-28 ENCOUNTER — Encounter (HOSPITAL_COMMUNITY)
Disposition: A | Discharge status: Home / Self Care | Payer: Self-pay | Source: Ambulatory Visit | Attending: Obstetrics and Gynecology

## 2014-07-28 ENCOUNTER — Inpatient Hospital Stay (HOSPITAL_COMMUNITY)
Admit: 2014-07-28 | Discharge: 2014-07-30 | DRG: 765 | Disposition: A | Discharge status: Home / Self Care | Payer: Medicaid Other | Source: Ambulatory Visit | Attending: Obstetrics and Gynecology | Admitting: Obstetrics and Gynecology

## 2014-07-28 ENCOUNTER — Encounter (HOSPITAL_COMMUNITY): Payer: Self-pay | Admitting: *Deleted

## 2014-07-28 DIAGNOSIS — O99284 Endocrine, nutritional and metabolic diseases complicating childbirth: Secondary | ICD-10-CM | POA: Diagnosis present

## 2014-07-28 DIAGNOSIS — Z3A40 40 weeks gestation of pregnancy: Secondary | ICD-10-CM | POA: Diagnosis present

## 2014-07-28 DIAGNOSIS — D6851 Activated protein C resistance: Secondary | ICD-10-CM | POA: Diagnosis present

## 2014-07-28 DIAGNOSIS — Z3483 Encounter for supervision of other normal pregnancy, third trimester: Secondary | ICD-10-CM | POA: Diagnosis present

## 2014-07-28 DIAGNOSIS — Z6791 Unspecified blood type, Rh negative: Secondary | ICD-10-CM

## 2014-07-28 DIAGNOSIS — O9081 Anemia of the puerperium: Secondary | ICD-10-CM | POA: Diagnosis present

## 2014-07-28 DIAGNOSIS — O26899 Other specified pregnancy related conditions, unspecified trimester: Secondary | ICD-10-CM | POA: Diagnosis present

## 2014-07-28 DIAGNOSIS — O480 Post-term pregnancy: Secondary | ICD-10-CM | POA: Diagnosis present

## 2014-07-28 DIAGNOSIS — D62 Acute posthemorrhagic anemia: Secondary | ICD-10-CM | POA: Diagnosis not present

## 2014-07-28 DIAGNOSIS — E7212 Methylenetetrahydrofolate reductase deficiency: Secondary | ICD-10-CM | POA: Diagnosis present

## 2014-07-28 DIAGNOSIS — O4290 Premature rupture of membranes, unspecified as to length of time between rupture and onset of labor, unspecified weeks of gestation: Secondary | ICD-10-CM | POA: Diagnosis present

## 2014-07-28 DIAGNOSIS — O3421 Maternal care for scar from previous cesarean delivery: Principal | ICD-10-CM | POA: Diagnosis present

## 2014-07-28 HISTORY — DX: Premature rupture of membranes, unspecified as to length of time between rupture and onset of labor, unspecified weeks of gestation: O42.90

## 2014-07-28 LAB — CBC
HEMATOCRIT: 37.3 % (ref 36.0–46.0)
HEMOGLOBIN: 12.6 g/dL (ref 12.0–15.0)
MCH: 31.5 pg (ref 26.0–34.0)
MCHC: 33.8 g/dL (ref 30.0–36.0)
MCV: 93.3 fL (ref 78.0–100.0)
Platelets: 229 10*3/uL (ref 150–400)
RBC: 4 MIL/uL (ref 3.87–5.11)
RDW: 13.1 % (ref 11.5–15.5)
WBC: 9.7 10*3/uL (ref 4.0–10.5)

## 2014-07-28 LAB — RPR: RPR: NONREACTIVE

## 2014-07-28 LAB — POCT FERN TEST: POCT Fern Test: POSITIVE

## 2014-07-28 SURGERY — Surgical Case
Anesthesia: Spinal

## 2014-07-28 MED ORDER — ONDANSETRON HCL 4 MG/2ML IJ SOLN: 4.0000 mg | Freq: Three times a day (TID) | INTRAMUSCULAR | Status: DC | PRN

## 2014-07-28 MED ORDER — NALOXONE HCL 0.4 MG/ML IJ SOLN
0.4000 mg | INTRAMUSCULAR | Status: DC | PRN
Start: 2014-07-28 — End: 2014-07-30

## 2014-07-28 MED ORDER — ENOXAPARIN SODIUM 40 MG/0.4ML ~~LOC~~ SOLN
40.0000 mg | SUBCUTANEOUS | Status: DC
  Administered 2014-07-29 – 2014-07-30 (×2): 40 mg via SUBCUTANEOUS
  Filled 2014-07-28 (×2): qty 0.4

## 2014-07-28 MED ORDER — LACTATED RINGERS IV SOLN
INTRAVENOUS | Status: DC | PRN
  Administered 2014-07-28: 12:00:00 via INTRAVENOUS

## 2014-07-28 MED ORDER — BUPIVACAINE HCL (PF) 0.25 % IJ SOLN
INTRAMUSCULAR | Status: AC
  Filled 2014-07-28: qty 10

## 2014-07-28 MED ORDER — BUPIVACAINE HCL (PF) 0.25 % IJ SOLN
INTRAMUSCULAR | Status: DC | PRN
  Administered 2014-07-28: 10 mL

## 2014-07-28 MED ORDER — MEPERIDINE HCL 25 MG/ML IJ SOLN: 6.2500 mg | INTRAMUSCULAR | Status: DC | PRN

## 2014-07-28 MED ORDER — ACETAMINOPHEN 325 MG PO TABS: 650.0000 mg | ORAL_TABLET | ORAL | Status: DC | PRN

## 2014-07-28 MED ORDER — PRENATAL MULTIVITAMIN CH
1.0000 | ORAL_TABLET | Freq: Every day | ORAL | Status: DC
Start: 2014-07-29 — End: 2014-07-30
  Administered 2014-07-29 – 2014-07-30 (×2): 1 via ORAL
  Filled 2014-07-28 (×2): qty 1

## 2014-07-28 MED ORDER — SODIUM CHLORIDE 0.9 % IJ SOLN: 3.0000 mL | INTRAMUSCULAR | Status: DC | PRN

## 2014-07-28 MED ORDER — SIMETHICONE 80 MG PO CHEW
80.0000 mg | CHEWABLE_TABLET | ORAL | Status: DC
  Administered 2014-07-28 – 2014-07-29 (×2): 80 mg via ORAL
  Filled 2014-07-28 (×2): qty 1

## 2014-07-28 MED ORDER — NALOXONE HCL 1 MG/ML IJ SOLN: 1.0000 ug/kg/h | INTRAVENOUS | Status: DC | PRN

## 2014-07-28 MED ORDER — MENTHOL 3 MG MT LOZG: 1.0000 | LOZENGE | OROMUCOSAL | Status: DC | PRN

## 2014-07-28 MED ORDER — DIPHENHYDRAMINE HCL 50 MG/ML IJ SOLN
INTRAMUSCULAR | Status: AC
  Filled 2014-07-28: qty 1

## 2014-07-28 MED ORDER — MORPHINE SULFATE (PF) 0.5 MG/ML IJ SOLN
INTRAMUSCULAR | Status: DC | PRN
  Administered 2014-07-28: .15 mg via INTRATHECAL

## 2014-07-28 MED ORDER — OXYCODONE-ACETAMINOPHEN 5-325 MG PO TABS: 1.0000 | ORAL_TABLET | ORAL | Status: DC | PRN

## 2014-07-28 MED ORDER — NALBUPHINE HCL 10 MG/ML IJ SOLN: 5.0000 mg | INTRAMUSCULAR | Status: DC | PRN

## 2014-07-28 MED ORDER — METHYLERGONOVINE MALEATE 0.2 MG/ML IJ SOLN: 0.2000 mg | INTRAMUSCULAR | Status: DC | PRN

## 2014-07-28 MED ORDER — CEFAZOLIN SODIUM-DEXTROSE 2-3 GM-% IV SOLR: 2.0000 g | INTRAVENOUS | Status: DC

## 2014-07-28 MED ORDER — SIMETHICONE 80 MG PO CHEW: 80.0000 mg | CHEWABLE_TABLET | ORAL | Status: DC | PRN

## 2014-07-28 MED ORDER — NALBUPHINE HCL 10 MG/ML IJ SOLN: 5.0000 mg | Freq: Once | INTRAMUSCULAR | Status: AC | PRN

## 2014-07-28 MED ORDER — CEFAZOLIN SODIUM-DEXTROSE 2-3 GM-% IV SOLR
INTRAVENOUS | Status: AC
  Filled 2014-07-28: qty 50

## 2014-07-28 MED ORDER — PHENYLEPHRINE 40 MCG/ML (10ML) SYRINGE FOR IV PUSH (FOR BLOOD PRESSURE SUPPORT)
PREFILLED_SYRINGE | INTRAVENOUS | Status: AC
  Filled 2014-07-28: qty 10

## 2014-07-28 MED ORDER — ONDANSETRON HCL 4 MG/2ML IJ SOLN: 4.0000 mg | Freq: Four times a day (QID) | INTRAMUSCULAR | Status: DC | PRN

## 2014-07-28 MED ORDER — FENTANYL CITRATE 0.05 MG/ML IJ SOLN: 25.0000 ug | INTRAMUSCULAR | Status: DC | PRN

## 2014-07-28 MED ORDER — OXYTOCIN 10 UNIT/ML IJ SOLN
INTRAMUSCULAR | Status: AC
  Filled 2014-07-28: qty 4

## 2014-07-28 MED ORDER — CITRIC ACID-SODIUM CITRATE 334-500 MG/5ML PO SOLN
30.0000 mL | ORAL | Status: DC | PRN
  Administered 2014-07-28: 30 mL via ORAL
  Filled 2014-07-28: qty 15

## 2014-07-28 MED ORDER — KETOROLAC TROMETHAMINE 30 MG/ML IJ SOLN
INTRAMUSCULAR | Status: AC
  Filled 2014-07-28: qty 1

## 2014-07-28 MED ORDER — LACTATED RINGERS IV SOLN
INTRAVENOUS | Status: DC
  Administered 2014-07-28: 17:00:00 via INTRAVENOUS

## 2014-07-28 MED ORDER — PHENYLEPHRINE 8 MG IN D5W 100 ML (0.08MG/ML) PREMIX OPTIME
INJECTION | INTRAVENOUS | Status: AC
  Filled 2014-07-28: qty 100

## 2014-07-28 MED ORDER — KETOROLAC TROMETHAMINE 30 MG/ML IJ SOLN
30.0000 mg | Freq: Four times a day (QID) | INTRAMUSCULAR | Status: AC | PRN
  Administered 2014-07-28: 30 mg via INTRAVENOUS

## 2014-07-28 MED ORDER — OXYTOCIN 40 UNITS IN LACTATED RINGERS INFUSION - SIMPLE MED
INTRAVENOUS | Status: DC | PRN
  Administered 2014-07-28: 40 [IU] via INTRAVENOUS

## 2014-07-28 MED ORDER — LACTATED RINGERS IV SOLN
INTRAVENOUS | Status: DC | PRN
  Administered 2014-07-28 (×3): via INTRAVENOUS

## 2014-07-28 MED ORDER — TETANUS-DIPHTH-ACELL PERTUSSIS 5-2.5-18.5 LF-MCG/0.5 IM SUSP: 0.5000 mL | Freq: Once | INTRAMUSCULAR | Status: DC

## 2014-07-28 MED ORDER — DIBUCAINE 1 % RE OINT: 1.0000 "application " | TOPICAL_OINTMENT | RECTAL | Status: DC | PRN

## 2014-07-28 MED ORDER — LANOLIN HYDROUS EX OINT: 1.0000 "application " | TOPICAL_OINTMENT | CUTANEOUS | Status: DC | PRN

## 2014-07-28 MED ORDER — LACTATED RINGERS IV SOLN
INTRAVENOUS | Status: DC
  Administered 2014-07-28: 09:00:00 via INTRAVENOUS

## 2014-07-28 MED ORDER — EPHEDRINE 5 MG/ML INJ
INTRAVENOUS | Status: AC
  Filled 2014-07-28: qty 10

## 2014-07-28 MED ORDER — SCOPOLAMINE 1 MG/3DAYS TD PT72
MEDICATED_PATCH | TRANSDERMAL | Status: AC
  Filled 2014-07-28: qty 1

## 2014-07-28 MED ORDER — DIPHENHYDRAMINE HCL 25 MG PO CAPS: 25.0000 mg | ORAL_CAPSULE | Freq: Four times a day (QID) | ORAL | Status: DC | PRN

## 2014-07-28 MED ORDER — OXYTOCIN BOLUS FROM INFUSION: 500.0000 mL | INTRAVENOUS | Status: DC

## 2014-07-28 MED ORDER — OXYCODONE-ACETAMINOPHEN 5-325 MG PO TABS: 2.0000 | ORAL_TABLET | ORAL | Status: DC | PRN

## 2014-07-28 MED ORDER — DIPHENHYDRAMINE HCL 50 MG/ML IJ SOLN
12.5000 mg | INTRAMUSCULAR | Status: DC | PRN
  Administered 2014-07-28: 12.5 mg via INTRAVENOUS

## 2014-07-28 MED ORDER — EPHEDRINE SULFATE 50 MG/ML IJ SOLN
INTRAMUSCULAR | Status: DC | PRN
  Administered 2014-07-28 (×2): 5 mg via INTRAVENOUS

## 2014-07-28 MED ORDER — FENTANYL CITRATE 0.05 MG/ML IJ SOLN
INTRAMUSCULAR | Status: DC | PRN
  Administered 2014-07-28: 25 ug via INTRATHECAL

## 2014-07-28 MED ORDER — IBUPROFEN 600 MG PO TABS: 600.0000 mg | ORAL_TABLET | Freq: Four times a day (QID) | ORAL | Status: DC | PRN

## 2014-07-28 MED ORDER — PHENYLEPHRINE 8 MG IN D5W 100 ML (0.08MG/ML) PREMIX OPTIME
INJECTION | INTRAVENOUS | Status: DC | PRN
  Administered 2014-07-28: 40 ug/min via INTRAVENOUS

## 2014-07-28 MED ORDER — BUPIVACAINE LIPOSOME 1.3 % IJ SUSP
20.0000 mL | Freq: Once | INTRAMUSCULAR | Status: DC
  Filled 2014-07-28: qty 20

## 2014-07-28 MED ORDER — KETOROLAC TROMETHAMINE 30 MG/ML IJ SOLN: 30.0000 mg | Freq: Four times a day (QID) | INTRAMUSCULAR | Status: AC | PRN

## 2014-07-28 MED ORDER — OXYTOCIN 40 UNITS IN LACTATED RINGERS INFUSION - SIMPLE MED: 62.5000 mL/h | INTRAVENOUS | Status: DC

## 2014-07-28 MED ORDER — OXYCODONE-ACETAMINOPHEN 5-325 MG PO TABS
2.0000 | ORAL_TABLET | ORAL | Status: DC | PRN
Start: 2014-07-28 — End: 2014-07-28

## 2014-07-28 MED ORDER — METOCLOPRAMIDE HCL 5 MG/ML IJ SOLN: 10.0000 mg | Freq: Once | INTRAMUSCULAR | Status: DC | PRN

## 2014-07-28 MED ORDER — IBUPROFEN 600 MG PO TABS
600.0000 mg | ORAL_TABLET | Freq: Four times a day (QID) | ORAL | Status: DC
  Administered 2014-07-28 – 2014-07-30 (×7): 600 mg via ORAL
  Filled 2014-07-28 (×7): qty 1

## 2014-07-28 MED ORDER — DIPHENHYDRAMINE HCL 25 MG PO CAPS
25.0000 mg | ORAL_CAPSULE | ORAL | Status: DC | PRN
  Filled 2014-07-28: qty 1

## 2014-07-28 MED ORDER — FENTANYL CITRATE 0.05 MG/ML IJ SOLN
INTRAMUSCULAR | Status: AC
  Filled 2014-07-28: qty 2

## 2014-07-28 MED ORDER — SENNOSIDES-DOCUSATE SODIUM 8.6-50 MG PO TABS
2.0000 | ORAL_TABLET | ORAL | Status: DC
  Administered 2014-07-28 – 2014-07-29 (×2): 2 via ORAL
  Filled 2014-07-28 (×2): qty 2

## 2014-07-28 MED ORDER — CEFAZOLIN SODIUM-DEXTROSE 2-3 GM-% IV SOLR
INTRAVENOUS | Status: DC | PRN
  Administered 2014-07-28: 2 g via INTRAVENOUS

## 2014-07-28 MED ORDER — SODIUM CHLORIDE 0.9 % IJ SOLN
INTRAMUSCULAR | Status: AC
  Filled 2014-07-28: qty 20

## 2014-07-28 MED ORDER — OXYTOCIN 40 UNITS IN LACTATED RINGERS INFUSION - SIMPLE MED: 62.5000 mL/h | INTRAVENOUS | Status: AC

## 2014-07-28 MED ORDER — FLEET ENEMA 7-19 GM/118ML RE ENEM: 1.0000 | ENEMA | RECTAL | Status: DC | PRN

## 2014-07-28 MED ORDER — BUPIVACAINE LIPOSOME 1.3 % IJ SUSP
INTRAMUSCULAR | Status: DC | PRN
  Administered 2014-07-28: 20 mL

## 2014-07-28 MED ORDER — METHYLERGONOVINE MALEATE 0.2 MG PO TABS: 0.2000 mg | ORAL_TABLET | ORAL | Status: DC | PRN

## 2014-07-28 MED ORDER — SCOPOLAMINE 1 MG/3DAYS TD PT72
1.0000 | MEDICATED_PATCH | Freq: Once | TRANSDERMAL | Status: DC
  Administered 2014-07-28: 1.5 mg via TRANSDERMAL

## 2014-07-28 MED ORDER — LIDOCAINE HCL (PF) 1 % IJ SOLN: 30.0000 mL | INTRAMUSCULAR | Status: DC | PRN

## 2014-07-28 MED ORDER — BUPIVACAINE IN DEXTROSE 0.75-8.25 % IT SOLN
INTRATHECAL | Status: DC | PRN
  Administered 2014-07-28: 1.8 mL via INTRATHECAL

## 2014-07-28 MED ORDER — MORPHINE SULFATE 0.5 MG/ML IJ SOLN
INTRAMUSCULAR | Status: AC
  Filled 2014-07-28: qty 10

## 2014-07-28 MED ORDER — ONDANSETRON HCL 4 MG/2ML IJ SOLN
INTRAMUSCULAR | Status: DC | PRN
  Administered 2014-07-28: 4 mg via INTRAVENOUS

## 2014-07-28 MED ORDER — ZOLPIDEM TARTRATE 5 MG PO TABS: 5.0000 mg | ORAL_TABLET | Freq: Every evening | ORAL | Status: DC | PRN

## 2014-07-28 MED ORDER — OXYCODONE-ACETAMINOPHEN 5-325 MG PO TABS
1.0000 | ORAL_TABLET | ORAL | Status: DC | PRN
  Administered 2014-07-29 (×2): 1 via ORAL
  Filled 2014-07-28 (×2): qty 1

## 2014-07-28 MED ORDER — ONDANSETRON HCL 4 MG/2ML IJ SOLN
INTRAMUSCULAR | Status: AC
  Filled 2014-07-28: qty 2

## 2014-07-28 MED ORDER — SIMETHICONE 80 MG PO CHEW
80.0000 mg | CHEWABLE_TABLET | Freq: Three times a day (TID) | ORAL | Status: DC
  Administered 2014-07-28 – 2014-07-30 (×5): 80 mg via ORAL
  Filled 2014-07-28 (×5): qty 1

## 2014-07-28 MED ORDER — WITCH HAZEL-GLYCERIN EX PADS: 1.0000 "application " | MEDICATED_PAD | CUTANEOUS | Status: DC | PRN

## 2014-07-28 MED ORDER — LACTATED RINGERS IV SOLN: 500.0000 mL | INTRAVENOUS | Status: DC | PRN

## 2014-07-28 SURGICAL SUPPLY — 37 items
CLAMP CORD UMBIL (MISCELLANEOUS) IMPLANT
CLOTH BEACON ORANGE TIMEOUT ST (SAFETY) ×3 IMPLANT
CONTAINER PREFILL 10% NBF 15ML (MISCELLANEOUS) IMPLANT
DERMABOND ADHESIVE PROPEN (GAUZE/BANDAGES/DRESSINGS) ×2
DERMABOND ADVANCED .7 DNX6 (GAUZE/BANDAGES/DRESSINGS) ×1 IMPLANT
DRAPE SHEET LG 3/4 BI-LAMINATE (DRAPES) IMPLANT
DRSG OPSITE POSTOP 4X10 (GAUZE/BANDAGES/DRESSINGS) ×3 IMPLANT
DURAPREP 26ML APPLICATOR (WOUND CARE) ×3 IMPLANT
ELECT REM PT RETURN 9FT ADLT (ELECTROSURGICAL) ×3
ELECTRODE REM PT RTRN 9FT ADLT (ELECTROSURGICAL) ×1 IMPLANT
EXTRACTOR VACUUM M CUP 4 TUBE (SUCTIONS) IMPLANT
EXTRACTOR VACUUM M CUP 4' TUBE (SUCTIONS)
GAUZE SPONGE 4X4 12PLY STRL (GAUZE/BANDAGES/DRESSINGS) ×6 IMPLANT
GLOVE BIO SURGEON STRL SZ7.5 (GLOVE) ×3 IMPLANT
GOWN STRL REUS W/TWL LRG LVL3 (GOWN DISPOSABLE) ×6 IMPLANT
KIT ABG SYR 3ML LUER SLIP (SYRINGE) IMPLANT
NEEDLE HYPO 22GX1.5 SAFETY (NEEDLE) ×3 IMPLANT
NEEDLE HYPO 25X5/8 SAFETYGLIDE (NEEDLE) IMPLANT
NEEDLE SPNL 20GX3.5 QUINCKE YW (NEEDLE) IMPLANT
NS IRRIG 1000ML POUR BTL (IV SOLUTION) ×3 IMPLANT
PACK C SECTION WH (CUSTOM PROCEDURE TRAY) ×3 IMPLANT
PAD ABD 7.5X8 STRL (GAUZE/BANDAGES/DRESSINGS) ×3 IMPLANT
PAD ABD 8X7 1/2 STERILE (GAUZE/BANDAGES/DRESSINGS) ×3 IMPLANT
STAPLER VISISTAT 35W (STAPLE) IMPLANT
SUT MNCRL 0 VIOLET CTX 36 (SUTURE) ×2 IMPLANT
SUT MNCRL AB 3-0 PS2 27 (SUTURE) ×3 IMPLANT
SUT MON AB 2-0 CT1 27 (SUTURE) ×3 IMPLANT
SUT MON AB-0 CT1 36 (SUTURE) ×6 IMPLANT
SUT MONOCRYL 0 CTX 36 (SUTURE) ×4
SUT PLAIN 0 NONE (SUTURE) IMPLANT
SUT PLAIN 2 0 (SUTURE)
SUT PLAIN 2 0 XLH (SUTURE) ×3 IMPLANT
SUT PLAIN ABS 2-0 CT1 27XMFL (SUTURE) IMPLANT
SYR 20CC LL (SYRINGE) IMPLANT
SYR CONTROL 10ML LL (SYRINGE) ×3 IMPLANT
TOWEL OR 17X24 6PK STRL BLUE (TOWEL DISPOSABLE) ×3 IMPLANT
TRAY FOLEY CATH 14FR (SET/KITS/TRAYS/PACK) ×3 IMPLANT

## 2014-07-28 NOTE — MAU Note (Signed)
Pt reports ROM at 0430, some contractions

## 2014-07-28 NOTE — H&P (Signed)
Amanda Bean is a 34 y.o. female presenting for SROM at 89, desires TOLAC. Maternal Medical History:  Reason for admission: Rupture of membranes.   Contractions: Onset was less than 1 hour ago.   Frequency: rare.   Perceived severity is mild.    Fetal activity: Perceived fetal activity is normal.   Last perceived fetal movement was within the past hour.    Prenatal complications: Thrombophilia.   Prenatal Complications - Diabetes: none.    OB History    Gravida Para Term Preterm AB TAB SAB Ectopic Multiple Living   2 1 1  0 0 0 0 0 0 1     Past Medical History  Diagnosis Date  . Factor V Leiden mutation   . Anemia   . Dysrhythmia     palpitations  . Unspecified sinusitis (chronic)   . History of chicken pox   . MTHFR deficiency complicating pregnancy, unspecified trimester   . Factor V Leiden mutation complicating pregnancy 05/26/4172  . Rupture, membranes, premature 07/28/2014   Past Surgical History  Procedure Laterality Date  . Wisdom tooth extraction    . Cesarean section  09/29/2011    Procedure: CESAREAN SECTION;  Surgeon: Amanda Bruins, MD;  Location: [facility];  Service: Gynecology;  Laterality: N/A;   Family History: family history includes Alcohol abuse in her brother; Anesthesia problems in her cousin; Depression in her maternal grandmother; Factor V Leiden deficiency in her maternal aunt and mother; Other in her maternal aunt and mother; Stroke in her maternal grandmother; Thyroid disease in her maternal aunt, maternal grandmother, and mother. Social History:  reports that she quit smoking about 11 years ago. She has never used smokeless tobacco. She reports that she does not drink alcohol or use illicit drugs.   Prenatal Transfer Tool  Maternal Diabetes: No Genetic Screening: Abnormal:  Results: Elevated AFP Maternal Ultrasounds/Referrals: Abnormal:  Findings:   Fetal renal pyelectasis Fetal Ultrasounds or other Referrals:  Referred to Materal Fetal  Medicine  Maternal Substance Abuse:  No Significant Maternal Medications:  None Significant Maternal Lab Results:  Lab values include: Other:  Factor V Leiden, MTHFR Other Comments:  Fetal pyelectasis resolved  Review of Systems  All other systems reviewed and are negative.   Dilation: 1 Effacement (%): Thick Exam by:: Amanda Bean,CNM Blood pressure 115/89, pulse 74, temperature 98.1 F (36.7 C), temperature source Oral, resp. rate 18, height 5\' 8"  (1.727 m), weight 88.451 kg (195 lb), last menstrual period 10/17/2013, SpO2 99 %, unknown if currently breastfeeding. Maternal Exam:  Uterine Assessment: Contraction strength is mild.  Contraction frequency is rare.   Abdomen: Patient reports no abdominal tenderness. Surgical scars: low transverse.   Fetal presentation: vertex  Introitus: Normal vulva. Normal vagina.  Ferning test: positive.  Nitrazine test: positive. Amniotic fluid character: clear.  Pelvis: questionable for delivery.   Cervix: Cervix evaluated by digital exam.     Physical Exam  Nursing note and vitals reviewed. Constitutional: She is oriented to person, place, and time. She appears well-developed and well-nourished.  HENT:  Head: Normocephalic and atraumatic.  Neck: Normal range of motion. Neck supple.  Cardiovascular: Normal rate and regular rhythm.   Respiratory: Effort normal and breath sounds normal.  GI: Soft. Bowel sounds are normal.  Genitourinary: Vagina normal and uterus normal.  Musculoskeletal: Normal range of motion.  Neurological: She is alert and oriented to person, place, and time.  Skin: Skin is warm and dry.  Psychiatric: She has a normal mood and affect.  Prenatal labs: ABO, Rh:   Antibody:   Rubella:   RPR:    HBsAg:    HIV:    GBS:     Assessment/Plan: 40+ weeks Previous Csection for TOLAC Factor V Leiden, MTHFR- on lovenox previous pregnancy but on Baby ASA this gestation with dc at 36 weeks. History of unexplained AFP  elevation- declined amnio History of fetal renal pyelectasis - resolved on fu sono Will admit and discuss options.   Amanda Bean J 07/28/2014, 9:28 AM

## 2014-07-28 NOTE — Transfer of Care (Signed)
Immediate Anesthesia Transfer of Care Note  Patient: Amanda Bean  Procedure(s) Performed: Procedure(s): CESAREAN SECTION (N/A)  Patient Location: PACU  Anesthesia Type:Spinal  Level of Consciousness: awake, alert  and oriented  Airway & Oxygen Therapy: Patient Spontanous Breathing  Post-op Assessment: Report given to RN and Post -op Vital signs reviewed and stable  Post vital signs: Reviewed and stable  Last Vitals:  Filed Vitals:   07/28/14 1221  BP:   Pulse: 75  Temp: 36.5 C  Resp:     Complications: No apparent anesthesia complications

## 2014-07-28 NOTE — Anesthesia Postprocedure Evaluation (Signed)
  Anesthesia Post-op Note  Patient: Amanda Bean  Procedure(s) Performed: Procedure(s): CESAREAN SECTION (N/A)  Patient Location: PACU  Anesthesia Type:General  Level of Consciousness: awake, alert  and oriented  Airway and Oxygen Therapy: Patient Spontanous Breathing  Post-op Pain: none  Post-op Assessment: Post-op Vital signs reviewed, Patient's Cardiovascular Status Stable, Respiratory Function Stable, Patent Airway, No signs of Nausea or vomiting, Pain level controlled, No headache, No backache, No residual numbness and No residual motor weakness  Post-op Vital Signs: Reviewed  Last Vitals:  Filed Vitals:   07/28/14 1432  BP: 127/69  Pulse: 70  Temp: 36.4 C  Resp:     Complications: No apparent anesthesia complications

## 2014-07-28 NOTE — Progress Notes (Signed)
Patient ID: Amanda Bean, female   DOB: Jul 29, 1980, 34 y.o.   MRN: [id number] Counseled pt about unfavorable cervix, IOL and prognosis for [facility] TOLAC consent done. Questions answered. Pt now desires repeat cesarean section. Risks vs benefits discussed.  Will proceed. OR notified.

## 2014-07-28 NOTE — Op Note (Signed)
Cesarean Section Procedure Note  Indications: previous uterine incision kerr x one  Pre-operative Diagnosis: 40 week 4 day pregnancy.  Post-operative Diagnosis: same  Surgeon: Lovenia Kim   Assistants: Renato Battles, CNM  Anesthesia: Local anesthesia 0.25.% bupivacaine and Spinal anesthesia  ASA Class: 2  Procedure Details  The patient was seen in the Holding Room. The risks, benefits, complications, treatment options, and expected outcomes were discussed with the patient.  The patient concurred with the proposed plan, giving informed consent. The risks of anesthesia, infection, bleeding and possible injury to other organs discussed. Injury to bowel, bladder, or ureter with possible need for repair discussed. Possible need for transfusion with secondary risks of hepatitis or HIV acquisition discussed. Post operative complications to include but not limited to DVT, PE and Pneumonia noted. The site of surgery properly noted/marked. The patient was taken to Operating Room # 9, identified as Amanda Bean and the procedure verified as C-Section Delivery. A Time Out was held and the above information confirmed.  After induction of anesthesia, the patient was draped and prepped in the usual sterile manner. A Pfannenstiel incision was made and carried down through the subcutaneous tissue to the fascia. Fascial incision was made and extended transversely using Mayo scissors. The fascia was separated from the underlying rectus tissue superiorly and inferiorly. The peritoneum was identified and entered. Peritoneal incision was extended longitudinally. The utero-vesical peritoneal reflection was incised transversely and the bladder flap was bluntly freed from the lower uterine segment. A low transverse uterine incision(Kerr hysterotomy) was made. Delivered from OP presentation was a  female with Apgar scores of 8 at one minute and 9 at five minutes. Bulb suctioning gently performed. Neonatal team in  attendance.After the umbilical cord was clamped and cut cord blood was obtained for evaluation. The placenta was removed intact and appeared normal. The uterus was curetted with a dry lap pack. Good hemostasis was noted.The uterine outline, tubes and ovaries appeared normal. The uterine incision was closed with running locked sutures of 0 Monocryl x 2 layers. Hemostasis was observed. Lavage was carried out until clear.The parietal peritoneum was closed with a running 2-0 Monocryl suture. The fascia was then reapproximated with running sutures of 0 Monocryl. The skin was reapproximated with 3-0 Monocryl after [place] closure with 2-0 Plain.  Instrument, sponge, and needle counts were correct prior the abdominal closure and at the conclusion of the case.   Findings: FTLM, OP, Antero fundal placenta.  Estimated Blood Loss:  500         Drains: foley                 Specimens: placenta to L&D                 Complications:  None; patient tolerated the procedure well.         Disposition: PACU - hemodynamically stable.         Condition: stable  Attending Attestation: I performed the procedure.

## 2014-07-28 NOTE — Lactation Note (Signed)
This note was copied from the chart of [place]. Lactation Consultation Note  Patient Name: Boy Lodema Parma LKHVF'M Date: 07/28/2014 Reason for consult: Initial assessment Baby 4 hours of life. Mom attempting to latch baby in cradle position, baby crying at breast. Mom reports baby nurse well in PACU, but not able to latch now. Offered to assist mom with latching and suggested cross-cradle. Mom states this is not comfortable. Suggested football, mom states she just wants to nurse baby like she is doing (in cradle position). Enc mom to use c-hold to sandwich breast in the same direction as baby's mouth, but mom continues scissor hold with breast sandwiched vertically against baby's mouth. Enc mom to turn baby's chest directly to hers, mom still not really wanting assistance. Enc mom to ask for assistance later if she continues to have difficulty. Enc mom to attempt football. Mom able to hand express colostrum. Mom states she had a great supply with first child. Enc mom to offer lots of STS and attempts at breast with cues and at least 8-12 times/24 hours. Mom given [facility] brochure, aware of OP/BFSG, community resources, and [facility] phone line assistance after D/C.   Maternal Data Has patient been taught Hand Expression?: Yes Does the patient have breastfeeding experience prior to this delivery?: Yes  Feeding Feeding Type: Breast Fed Length of feed: 0 min  LATCH Score/Interventions Latch: Too sleepy or reluctant, no latch achieved, no sucking elicited.                    Lactation Tools Discussed/Used     Consult Status Consult Status: Follow-up Date: 07/29/14 Follow-up type: In-patient    Inocente Salles 07/28/2014, 4:35 PM

## 2014-07-28 NOTE — Anesthesia Procedure Notes (Signed)
Spinal Patient location during procedure: OR Start time: 07/28/2014 11:13 AM Staffing Anesthesiologist: Josephine Igo Performed by: anesthesiologist  Preanesthetic Checklist Completed: patient identified, site marked, surgical consent, pre-op evaluation, timeout performed, IV checked, risks and benefits discussed and monitors and equipment checked Spinal Block Patient position: sitting Prep: site prepped and draped and DuraPrep Patient monitoring: heart rate, cardiac monitor, continuous pulse ox and blood pressure Approach: midline Location: L3-4 Injection technique: single-shot Needle Needle type: Sprotte  Needle gauge: 24 G Needle length: 9 cm Needle insertion depth: 6 cm Assessment Sensory level: T4 Additional Notes Patient tolerated procedure well. Adequate sensory level.

## 2014-07-28 NOTE — Consult Note (Signed)
Neonatology Note:   Attendance at C-section:    I was asked by Dr. Ronita Hipps to attend this repeat C/S at term. The mother is a G2P1 O neg, GBS neg with Factor V Leiden deficiency, on baby aspirin until 36 weeks. ROM 7 hours prior to delivery, fluid with light meconium. Infant vigorous with good spontaneous cry and tone. Needed only minimal bulb suctioning. Ap 9/9. Lungs clear to ausc in DR. To CN to care of Pediatrician. Fetal ultrasound showed pyelectasis, will need follow-up. Of note, infant's sibling had craniosynostosis.   Real Cons, MD

## 2014-07-28 NOTE — Anesthesia Preprocedure Evaluation (Addendum)
Anesthesia Evaluation  Patient identified by MRN, date of birth, ID band Patient awake    Reviewed: Allergy & Precautions, NPO status , Patient's Chart, lab work & pertinent test results  Airway Mallampati: II  TM Distance: >3 FB Neck ROM: Full    Dental no notable dental hx. (+) Teeth Intact   Pulmonary former smoker,  breath sounds clear to auscultation  Pulmonary exam normal       Cardiovascular negative cardio ROS  Rhythm:Regular Rate:Normal     Neuro/Psych negative neurological ROS  negative psych ROS   GI/Hepatic negative GI ROS, Neg liver ROS,   Endo/Other  negative endocrine ROS  Renal/GU negative Renal ROS  negative genitourinary   Musculoskeletal negative musculoskeletal ROS (+)   Abdominal (+) - obese,   Peds  Hematology  (+) anemia , Factor V Leiden mutation MTHFR mutation   Anesthesia Other Findings   Reproductive/Obstetrics (+) Pregnancy Previous C/Section SROM                            Anesthesia Physical Anesthesia Plan  ASA: II  Anesthesia Plan: Spinal   Post-op Pain Management:    Induction:   Airway Management Planned: Natural Airway  Additional Equipment:   Intra-op Plan:   Post-operative Plan:   Informed Consent: I have reviewed the patients History and Physical, chart, labs and discussed the procedure including the risks, benefits and alternatives for the proposed anesthesia with the patient or authorized representative who has indicated his/her understanding and acceptance.     Plan Discussed with: Anesthesiologist, CRNA and Surgeon  Anesthesia Plan Comments:         Anesthesia Quick Evaluation

## 2014-07-29 ENCOUNTER — Encounter (HOSPITAL_COMMUNITY): Payer: Self-pay | Admitting: Obstetrics and Gynecology

## 2014-07-29 DIAGNOSIS — D6851 Activated protein C resistance: Secondary | ICD-10-CM | POA: Diagnosis present

## 2014-07-29 DIAGNOSIS — O26899 Other specified pregnancy related conditions, unspecified trimester: Secondary | ICD-10-CM | POA: Diagnosis present

## 2014-07-29 DIAGNOSIS — D62 Acute posthemorrhagic anemia: Secondary | ICD-10-CM | POA: Diagnosis not present

## 2014-07-29 DIAGNOSIS — Z6791 Unspecified blood type, Rh negative: Secondary | ICD-10-CM

## 2014-07-29 LAB — CBC
HCT: 28.9 % — ABNORMAL LOW (ref 36.0–46.0)
HEMOGLOBIN: 10 g/dL — AB (ref 12.0–15.0)
MCH: 32.9 pg (ref 26.0–34.0)
MCHC: 34.6 g/dL (ref 30.0–36.0)
MCV: 95.1 fL (ref 78.0–100.0)
Platelets: 164 10*3/uL (ref 150–400)
RBC: 3.04 MIL/uL — ABNORMAL LOW (ref 3.87–5.11)
RDW: 13.3 % (ref 11.5–15.5)
WBC: 9.9 10*3/uL (ref 4.0–10.5)

## 2014-07-29 LAB — BIRTH TISSUE RECOVERY COLLECTION (PLACENTA DONATION)

## 2014-07-29 MED ORDER — MAGNESIUM OXIDE 400 (241.3 MG) MG PO TABS
200.0000 mg | ORAL_TABLET | Freq: Every day | ORAL | Status: DC
  Administered 2014-07-29 – 2014-07-30 (×2): 200 mg via ORAL
  Filled 2014-07-29 (×2): qty 0.5

## 2014-07-29 NOTE — Addendum Note (Signed)
Addendum  created 07/29/14 0836 by Brock Ra, CRNA   Modules edited: Notes Section   Notes Section:  File: [id number]

## 2014-07-29 NOTE — Anesthesia Postprocedure Evaluation (Signed)
  Anesthesia Post-op Note  Patient: Amanda Bean  Procedure(s) Performed: Procedure(s): CESAREAN SECTION (N/A)  Patient Location: Mother/Baby  Anesthesia Type:Spinal  Level of Consciousness: awake, alert , oriented and patient cooperative  Airway and Oxygen Therapy: Patient Spontanous Breathing  Post-op Pain: mild  Post-op Assessment: Post-op Vital signs reviewed, Patient's Cardiovascular Status Stable, Respiratory Function Stable, Patent Airway, No signs of Nausea or vomiting, Adequate PO intake, Pain level controlled, No headache, No backache, No residual numbness and No residual motor weakness  Post-op Vital Signs: Reviewed and stable  Last Vitals:  Filed Vitals:   07/29/14 0450  BP: 98/60  Pulse: 63  Temp: 36.7 C  Resp: 18    Complications: No apparent anesthesia complications

## 2014-07-29 NOTE — Progress Notes (Addendum)
POD # 1  Subjective: Pt reports feeling well/ Pain controlled with Motrin and long-acting intrathecal Tolerating po/ Foley d/c'd and voiding without problems/ No n/v/ Flatus present Activity: up with assistance Bleeding is light Newborn info:  Information for the patient's newborn:  Amanda Bean, Amanda Bean [[id number]]  female   Circumcision: planning outpt/ Feeding: breast   Objective:  VS:  Filed Vitals:   07/28/14 2000 07/28/14 2318 07/29/14 0450 07/29/14 0925  BP:  98/61 98/60 100/53  Pulse:  73 63 76  Temp: 98 F (36.7 C) 98.3 F (36.8 C) 98 F (36.7 C) 98.1 F (36.7 C)  TempSrc: Oral Oral Oral Oral  Resp: 18 18 18 20   Height:      Weight:      SpO2: 100% 98% 98% 99%     I&O: Intake/Output      03/22 0701 - 03/23 0700 03/23 0701 - 03/24 0700   P.O. 1200    I.V. (mL/kg) 3645.4 (41.2)    Total Intake(mL/kg) 4845.4 (54.8)    Urine (mL/kg/hr) 1680 (0.8)    Blood 700 (0.3)    Total Output 2380     Net +2465.4             Recent Labs  07/28/14 0900 07/29/14 0615  WBC 9.7 9.9  HGB 12.6 10.0*  HCT 37.3 28.9*  PLT 229 164    Blood type: --/--/O NEG (03/22 0900) Rubella:   Immune   Physical Exam:  General: alert and cooperative CV: Regular rate and rhythm Resp: CTA bilaterally Abdomen: soft, nontender, normal bowel sounds Incision: healing well, no drainage, no erythema, no hernia, no seroma, no swelling, well approximated with suture, honeycomb dsh c/d/i Uterine Fundus: firm, below umbilicus, nontender Lochia: minimal Ext: extremities normal, atraumatic, no cyanosis or edema and Homans sign is negative, no sign of DVT    Assessment: POD # 1/ G2P2002/ S/P C/Section d/t SROM, repeat  Factor V Leiden ABL anemia Rh negative-baby Rh neg Doing well  Plan: Start Lovenox today Start Niferex Ambulate Continue routine post op orders May be interested in early discharge tomorrow   Signed: Julianne Handler, Delane Ginger, MSN, CNM 07/29/2014, 9:52 AM

## 2014-07-30 LAB — TYPE AND SCREEN
ABO/RH(D): O NEG
Antibody Screen: POSITIVE
DAT, IgG: NEGATIVE
UNIT DIVISION: 0
Unit division: 0

## 2014-07-30 MED ORDER — MAGNESIUM OXIDE 400 (241.3 MG) MG PO TABS: 200.0000 mg | ORAL_TABLET | Freq: Every day | ORAL | Status: AC

## 2014-07-30 MED ORDER — TRAMADOL HCL 50 MG PO TABS
50.0000 mg | ORAL_TABLET | Freq: Four times a day (QID) | ORAL | Status: DC
  Administered 2014-07-30: 50 mg via ORAL
  Filled 2014-07-30: qty 1

## 2014-07-30 MED ORDER — ENOXAPARIN SODIUM 40 MG/0.4ML ~~LOC~~ SOLN: 40.0000 mg | SUBCUTANEOUS | Status: DC

## 2014-07-30 MED ORDER — ASPIRIN 81 MG PO CHEW: 81.0000 mg | CHEWABLE_TABLET | Freq: Every day | ORAL | Status: DC

## 2014-07-30 MED ORDER — POLYSACCHARIDE IRON COMPLEX 150 MG PO CAPS: 150.0000 mg | ORAL_CAPSULE | Freq: Every day | ORAL | Status: DC

## 2014-07-30 MED ORDER — ACETAMINOPHEN 500 MG PO TABS: 1000.0000 mg | ORAL_TABLET | ORAL | Status: DC | PRN

## 2014-07-30 MED ORDER — TRAMADOL HCL 50 MG PO TABS: 50.0000 mg | ORAL_TABLET | Freq: Four times a day (QID) | ORAL | Status: DC

## 2014-07-30 NOTE — Discharge Instructions (Signed)
Take Lovenox daily for 6 weeks - unless otherwise instructed by your physician Start daily ASPIRIN dose after completion of Lovenox for daily prevention for blood clots due to genetic risks

## 2014-07-30 NOTE — Discharge Summary (Signed)
POSTOPERATIVE DISCHARGE SUMMARY:  Patient ID: Amanda Bean MRN: [id number] DOB/AGE: [birth year] 34 y.o.  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 Factor V Leiden mutation   . Anemia   . Dysrhythmia     palpitations  . Unspecified sinusitis (chronic)   . History of chicken pox   . MTHFR deficiency complicating pregnancy, unspecified trimester   . Factor V Leiden mutation complicating pregnancy 11/01/348  . Rupture, membranes, premature 07/28/2014  . Postpartum care following cesarean delivery (3/22) 07/28/2014    Past surgical history:  Past Surgical History  Procedure Laterality Date  . Wisdom tooth extraction    . Cesarean section  09/29/2011    Procedure: CESAREAN SECTION;  Surgeon: [name], MD;  Location: [facility];  Service: Gynecology;  Laterality: N/A;  . Cesarean section N/A 07/28/2014    Procedure: CESAREAN SECTION;  Surgeon: Brien Few, MD;  Location: [facility];  Service:  Obstetrics;  Laterality: N/A;    Family History:  Family History  Problem Relation Age of Onset  . Thyroid disease Mother   . Other Mother     hemoglobinopathy  . Factor V Leiden deficiency Mother   . Alcohol abuse Brother   . Thyroid disease Maternal Aunt   . Other Maternal Aunt     hemoglobinopathy  . Factor V Leiden deficiency Maternal Aunt   . Thyroid disease Maternal Grandmother   . Stroke Maternal Grandmother   . Depression Maternal Grandmother   . Anesthesia problems Cousin     allergic to anesthesia    Social History:  reports that she quit smoking about 11 years ago. She has never used smokeless tobacco. She reports that she does not drink alcohol or use illicit drugs.  Allergies: Latex   Current Medications at time of admission:  Prior to Admission medications   Medication Sig Start Date End Date Taking? Authorizing Provider  Prenatal MV-Min-Fe Fum-FA-DHA (PRENATAL 1 PO) Take 1 each by mouth daily.     Yes Historical Provider, MD  Doxylamine-Pyridoxine (DICLEGIS PO) Take by mouth.    Historical Provider, MD  ferrous fumarate (HEMOCYTE - 106 MG FE) 325 (106 FE) MG TABS Take 1 tablet by mouth.    Historical Provider, MD    Intrapartum Course:  Admit for onset of labor with SROM - dilation 1cm and thick without active labor / planned TOLAC Remote from delivery with need to augment/induce for labor progression Decision to proceed to repeat CS versus augmented TOLAC Repeat cesarean section delivery  Procedures: Cesarean section delivery on 07/28/2014  with delivery of female newborn by Dr Ronita Hipps   See operative report for further details APGAR (1 MIN): 9   APGAR (5 MINS): 9    Postoperative / postpartum course:  Initiated on Lovenox POD 1 - 40 subQdaily dose Uncomplicated with discharge on POD 2  Discharge Instructions:  Discharged Condition: stable  Activity: pelvic rest and postoperative restrictions x 2   Diet: routine  Medications:    Medication List     STOP taking these medications        DICLEGIS PO      TAKE these medications        acetaminophen 500 MG tablet  Commonly known as:  TYLENOL  Take 2 tablets (1,000 mg total) by mouth every 4 (four) hours as needed for mild pain (for pain scale < 4).     aspirin 81 MG chewable tablet  Chew 1 tablet (81 mg total) by mouth daily. Resume daily preventative therapy AFTER completion of Lovenox postpartum     enoxaparin 40 MG/0.4ML injection  Commonly known as:  LOVENOX  Inject 0.4 mLs (40 mg total) into the skin daily. Take daily x 6 weeks postpartum - states that she has 2 week supply at home     ferrous fumarate 325 (106 FE) MG Tabs tablet  Commonly known as:  HEMOCYTE - 106 mg FE  Take 1 tablet by mouth.     magnesium oxide 400 (241.3 MG) MG tablet  Commonly known as:  MAG-OX  Take 0.5 tablets (200 mg total) by mouth daily.     PRENATAL 1 PO  Take 1 each by mouth daily.     traMADol 50 MG tablet  Commonly known as:  ULTRAM  Take 1 tablet (50 mg total) by mouth every 6 (six) hours.        Wound Care: keep clean and dry / remove honeycomb POD 5 ( may call office for apt Monday if unable to remove dressing over weekend) Postpartum Instructions: [facility] discharge booklet - instructions reviewed  Discharge to: Home  Follow up :  Call office to ask any questions to [facility] regarding Lovenox management interval  Recommend Lovenox x 6 weeks then return to daily prophylaxis ASA [facility] in 6 weeks for routine postpartum visit with Dr Ronita Hipps                Signed: Artelia Laroche CNM, MSN, [facility] 07/30/2014, 11:12 AM

## 2014-07-30 NOTE — Progress Notes (Signed)
POSTOPERATIVE DAY # 2 S/P CS-repeat  S:         Reports feeling well - ready for DC home             Tolerating po intake / no nausea / no vomiting / + flatus / + BM             Bleeding is light             Pain controlled with mostly motrin due to intense itching with percocet             Up ad lib / ambulatory/ voiding QS  Newborn breast feeding  / Circumcision planned (outpatient Wednesday)   O:  VS: BP 110/69 mmHg  Pulse 78  Temp(Src) 98 F (36.7 C) (Oral)  Resp 16  Ht 5\' 8"  (1.727 m)  Wt 88.451 kg (195 lb)  BMI 29.66 kg/m2  SpO2 99%  LMP 10/17/2013  Breastfeeding? Unknown   LABS:               Recent Labs  07/28/14 0900 07/29/14 0615  WBC 9.7 9.9  HGB 12.6 10.0*  PLT 229 164               Bloodtype: --/--/O NEG (03/22 0900)  / baby RH negative - no Rhophylac indicated  Rubella:     Immune             Leiden V heterozygous / MTFHR carrier                            Physical Exam:             Alert and Oriented X3  Abdomen: soft, non-tender, non-distended              Fundus: firm, non-tender, Ueven             Dressing intact honeycomb              Incision:  approximated with suture / no erythema / no ecchymosis / no drainage  Perineum: intact  Lochia: light  Extremities: trace pedal edema, no calf pain or tenderness  A:        POD # 2 S/P CS            Hx genetic thrombophilia - heterozygous Leiden and 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

## 2015-10-31 DIAGNOSIS — Z713 Dietary counseling and surveillance: Secondary | ICD-10-CM | POA: Diagnosis not present

## 2015-10-31 DIAGNOSIS — Z131 Encounter for screening for diabetes mellitus: Secondary | ICD-10-CM | POA: Diagnosis not present

## 2015-10-31 DIAGNOSIS — Z136 Encounter for screening for cardiovascular disorders: Secondary | ICD-10-CM | POA: Diagnosis not present

## 2015-10-31 DIAGNOSIS — Z13220 Encounter for screening for lipoid disorders: Secondary | ICD-10-CM | POA: Diagnosis not present

## 2015-11-05 DIAGNOSIS — Z01419 Encounter for gynecological examination (general) (routine) without abnormal findings: Secondary | ICD-10-CM | POA: Diagnosis not present

## 2015-11-05 DIAGNOSIS — Z6825 Body mass index (BMI) 25.0-25.9, adult: Secondary | ICD-10-CM | POA: Diagnosis not present

## 2015-11-05 DIAGNOSIS — Z1151 Encounter for screening for human papillomavirus (HPV): Secondary | ICD-10-CM | POA: Diagnosis not present

## 2016-01-18 IMAGING — US US OB DETAIL+14 WK
1 series · 12 of 28 positions shown · non-contrast
Comparison: none

[Series 1: us ob detail+14 wk · 0.18mm/px · 12 of 95 slices shown]
[im 4/95]
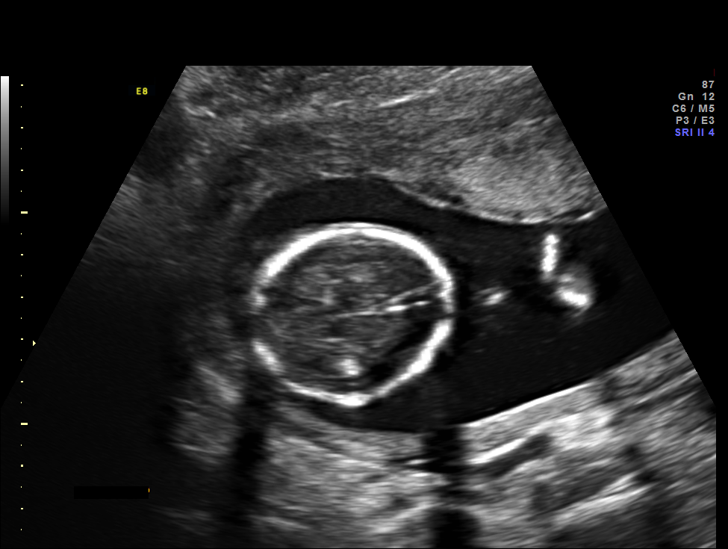
[im 11/95]
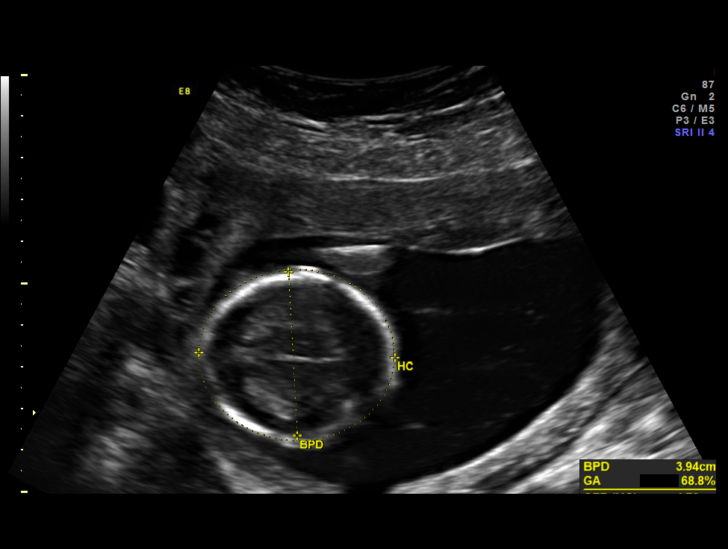
[im 18/95]
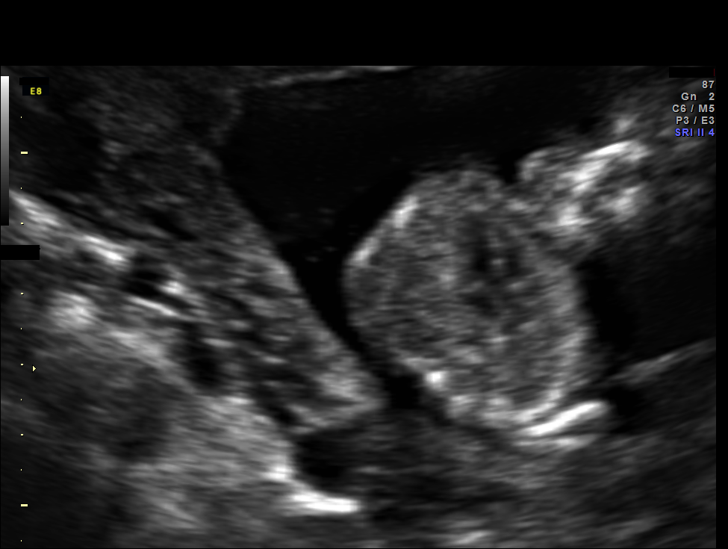
[im 28/95]
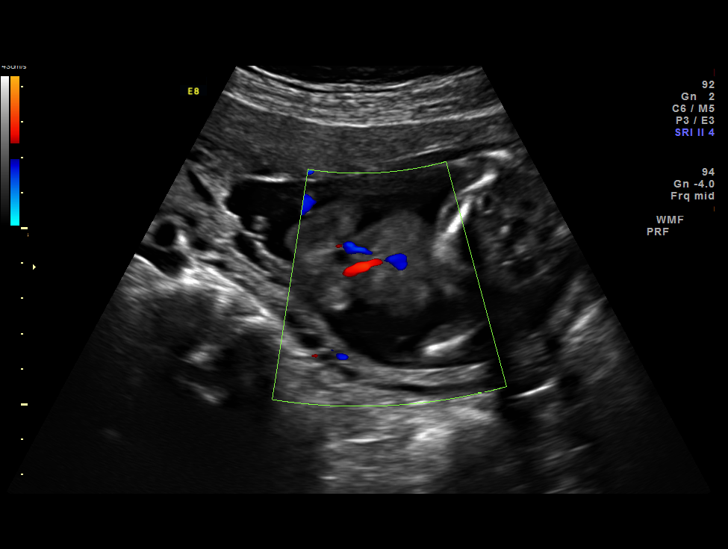
[im 35/95]
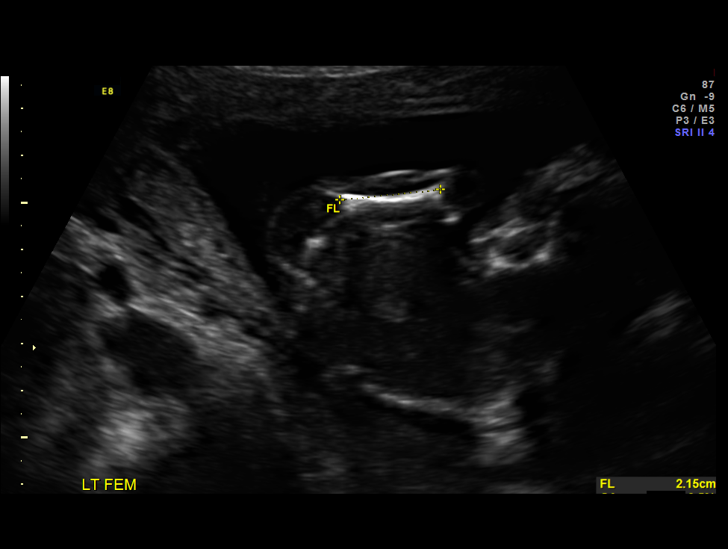
[im 42/95]
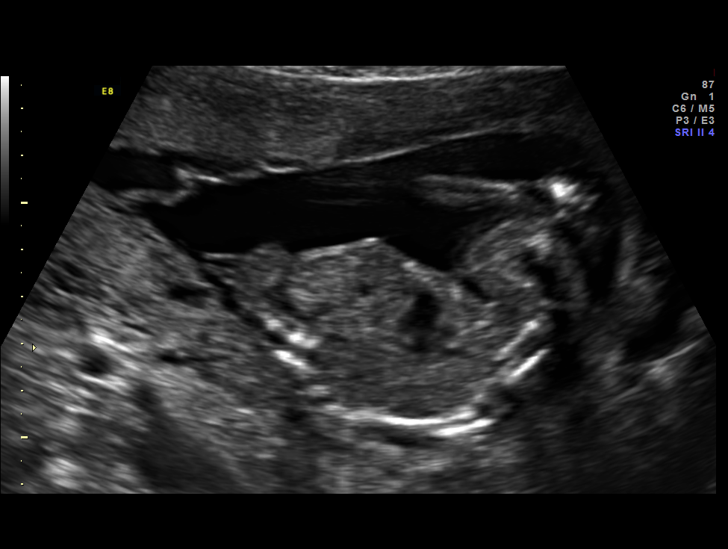
[im 53/95]
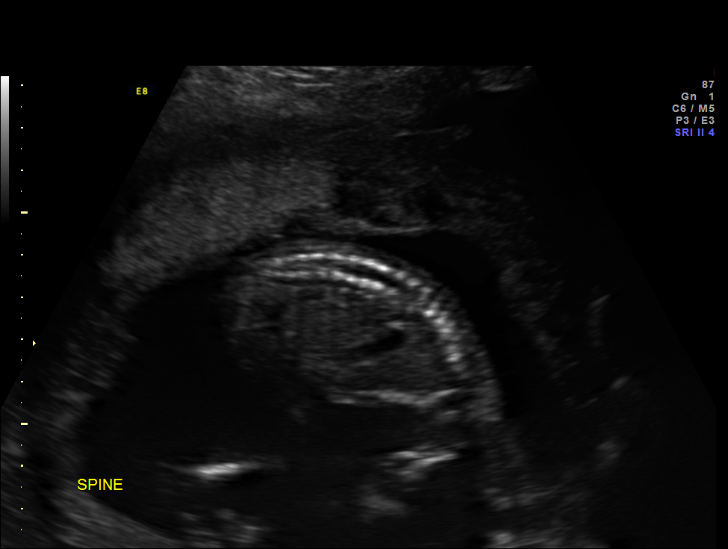
[im 60/95]
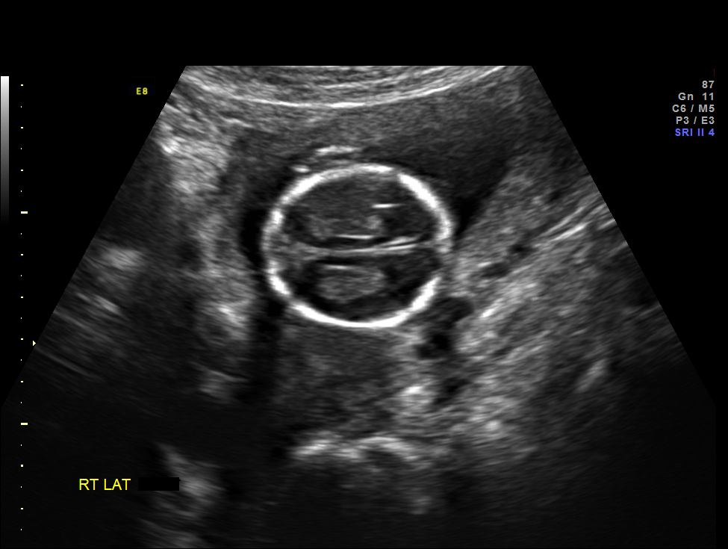
[im 67/95]
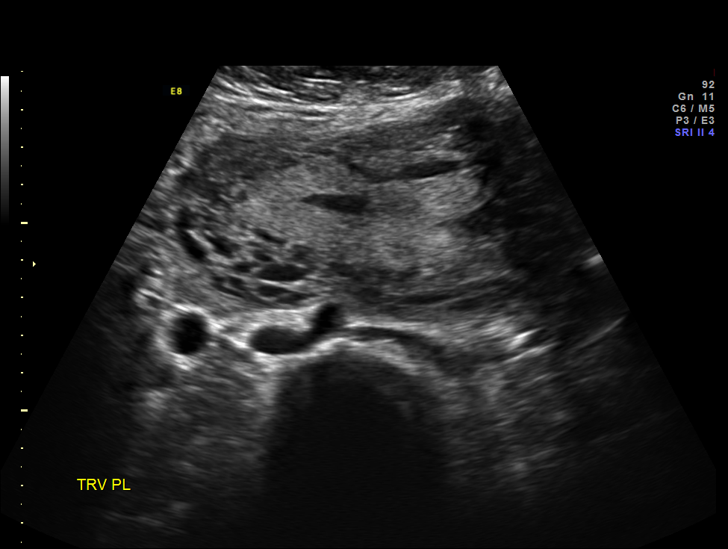
[im 77/95]
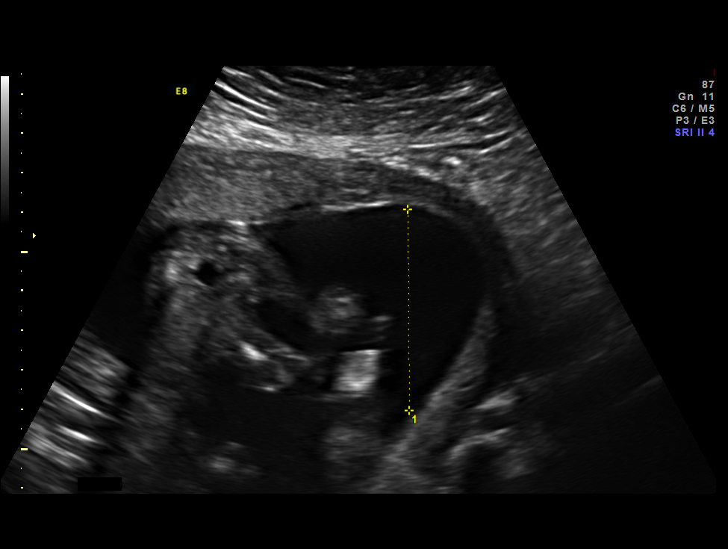
[im 84/95]
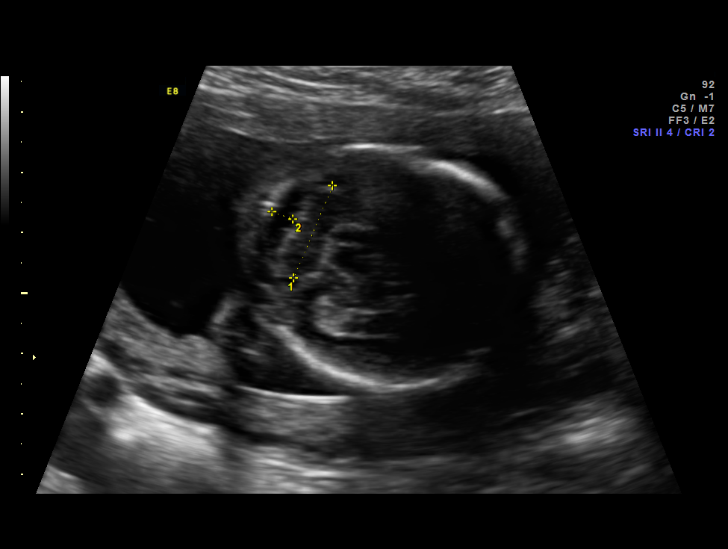
[im 91/95]
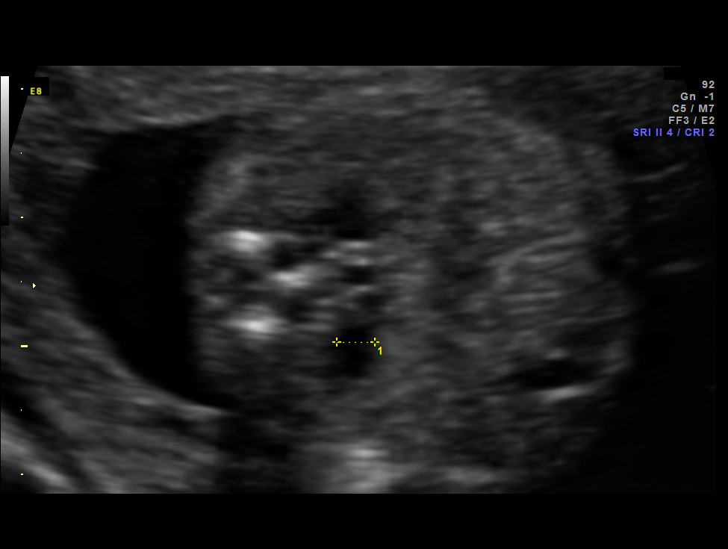

[12 of 28 positions shown; findings below may reference images not displayed]

OBSTETRICS REPORT
                      (Signed Final 02/17/2014 [DATE])

Service(s) Provided

 US OB DETAIL + 14 WK                                  76811.0
Indications

 17 weeks gestation of pregnancy
 Detailed fetal anatomic survey                        Z36
 Factor V Leiden mutation
 Previous cesarean section
 Elevated MSAFP (2.55 MoM); 1 in 262 risk of
 ONTD
Fetal Evaluation

 Num Of Fetuses:    1
 Fetal Heart Rate:  144                          bpm
 Cardiac Activity:  Observed
 Presentation:      Cephalic
 Placenta:          Fundal, above cervical os
 P. Cord            Visualized, central
 Insertion:

 Amniotic Fluid
 AFI FV:      Subjectively within normal limits
                                             Larg Pckt:    5.11  cm
Biometry

 BPD:     39.4  mm     G. Age:  18w 0d                CI:         84.2   70 - 86
 OFD:     46.8  mm                                    FL/HC:      15.7   15.8 -
                                                                         18
 HC:     140.5  mm     G. Age:  17w 2d       32  %    HC/AC:      1.21   1.07 -

 AC:     116.2  mm     G. Age:  17w 3d       42  %    FL/BPD:
 FL:        22  mm     G. Age:  16w 4d       14  %    FL/AC:      18.9   20 - 24
 HUM:     22.1  mm     G. Age:  16w 5d       32  %
 CER:     15.6  mm     G. Age:  15w 4d        6  %
 NFT:      2.1  mm

 Est. FW:     180  gm      0 lb 6 oz     41  %
Gestational Age

 LMP:           17w 4d        Date:  10/17/13                 EDD:   07/24/14
 Clinical EDD:  17w 4d                                        EDD:   07/24/14
 U/S Today:     17w 2d                                        EDD:   07/26/14
 Best:          17w 4d     Det. By:  LMP  (10/17/13)          EDD:   07/24/14
Anatomy

 Cranium:          Appears normal         Aortic Arch:      Not well visualized
 Fetal Cavum:      Not well visualized    Ductal Arch:      Not well visualized
 Ventricles:       Appears normal         Diaphragm:        Appears normal
 Choroid Plexus:   Appears normal         Stomach:          Appears normal, left
                                                            sided
 Cerebellum:       Appears normal         Abdomen:          Appears normal
 Posterior Fossa:  Appears normal         Abdominal Wall:   Appears nml (cord
                                                            insert, abd wall)
 Nuchal Fold:      Appears normal         Cord Vessels:     Appears normal (3
                                                            vessel cord)
 Face:             Appears normal         Kidneys:          Left sided
                   (orbits and profile)
                                                            pyelectasis,
                                                            mm
 Lips:             Appears normal         Bladder:          Appears normal
 Heart:            Appears normal         Spine:            Appears normal
                   (4CH, axis, and
                   situs)
 RVOT:             Appears normal         Lower             Appears normal
                                          Extremities:
 LVOT:             Appears normal         Upper             Appears normal
                                          Extremities:

 Other:  Fetus appears to be a male.
Targeted Anatomy

 Fetal Central Nervous System
 Cisterna Magna:
Cervix Uterus Adnexa

 Cervical Length:    3.31     cm

 Cervix:       Normal appearance by transabdominal scan.
 Uterus:       No abnormality visualized.
 Cul De Sac:   No free fluid seen.

 Left Ovary:    No adnexal mass visualized.
 Right Ovary:   No adnexal mass visualized.

 Adnexa:     No adnexal mass visualized.
Impression

 SIUP at 17+4 weeks
 Mild pyelectasis on left
 All other detailed fetal anatomy was seen and appeared
 normal; limited views of CSP and arches
 Markers of aneuploidy: none
 Normal amniotic fluid volume
 Measurements consistent with LMP dating

 The US findings were shared with Ms. Aburto. The implications
 of an elevated MSAFP were discussed in detail. Fortunately,
 no structural fetal abnormalities were identified to explain the
 elevated MSAFP. Specifically, the spine, posterior fossa and
 abdominal wall were seen and appeared normal.  She met
 with our genetic counselor and decided not to pursue further
 testing.
Recommendations

 Follow-up ultrasound at 
 28 weeks to reassess kidneys (>28
 weeks, normal range = 7-10 mms) and growth (unexplained
 elevated MSAFP)
 Please see genetic counseling note

 questions or concerns.

## 2016-04-11 DIAGNOSIS — R05 Cough: Secondary | ICD-10-CM | POA: Diagnosis not present

## 2016-04-11 DIAGNOSIS — J019 Acute sinusitis, unspecified: Secondary | ICD-10-CM | POA: Diagnosis not present

## 2016-04-11 DIAGNOSIS — J209 Acute bronchitis, unspecified: Secondary | ICD-10-CM | POA: Diagnosis not present

## 2017-01-09 DIAGNOSIS — R635 Abnormal weight gain: Secondary | ICD-10-CM | POA: Diagnosis not present

## 2017-01-09 DIAGNOSIS — Z01419 Encounter for gynecological examination (general) (routine) without abnormal findings: Secondary | ICD-10-CM | POA: Diagnosis not present

## 2017-01-09 DIAGNOSIS — Z6826 Body mass index (BMI) 26.0-26.9, adult: Secondary | ICD-10-CM | POA: Diagnosis not present

## 2017-06-03 DIAGNOSIS — K353 Acute appendicitis with localized peritonitis, without perforation or gangrene: Secondary | ICD-10-CM | POA: Diagnosis not present

## 2017-06-03 DIAGNOSIS — K3580 Unspecified acute appendicitis: Secondary | ICD-10-CM | POA: Diagnosis not present

## 2017-06-03 DIAGNOSIS — Z91040 Latex allergy status: Secondary | ICD-10-CM | POA: Diagnosis not present

## 2017-06-03 DIAGNOSIS — R1031 Right lower quadrant pain: Secondary | ICD-10-CM | POA: Diagnosis not present

## 2017-06-03 DIAGNOSIS — N270 Small kidney, unilateral: Secondary | ICD-10-CM | POA: Diagnosis not present

## 2017-06-03 DIAGNOSIS — R1033 Periumbilical pain: Secondary | ICD-10-CM | POA: Diagnosis not present

## 2017-06-03 DIAGNOSIS — R109 Unspecified abdominal pain: Secondary | ICD-10-CM | POA: Diagnosis not present

## 2017-06-03 DIAGNOSIS — R630 Anorexia: Secondary | ICD-10-CM | POA: Diagnosis not present

## 2017-06-03 DIAGNOSIS — N83201 Unspecified ovarian cyst, right side: Secondary | ICD-10-CM | POA: Diagnosis not present

## 2017-06-03 NOTE — Care Plan (Signed)
 Problem: Gas Exchange - Impaired Goal: Absence of hypoxia Outcome: Progressing Patient   Problem: Infection Risk, Surgical Site Goal: Absence of infection signs and symptoms Outcome: Progressing Patients surgical sites are clean dry intact and there in no redness in the area with mild pain  Problem: Urinary Retention Goal: Able to void spontaneously Outcome: Not Progressing Patient has not voided since surgery will continue to monitor

## 2017-06-04 NOTE — Discharge Summary (Addendum)
 [facility]  Discharge Summary  PCP: No primary care provider on file. Discharge Details   Admit date:         06/03/2017 Discharge date:         06/04/2017  Hospital LOS:    1 days  Active Hospital Problems   Diagnosis Date Noted POA   Acute appendicitis 06/03/2017 Yes    Resolved Hospital Problems   Diagnosis Date Noted Date Resolved POA  No resolved problems to display.    Discharge Medication List as of 06/04/2017  1:59 PM    NEW medications   Details  HYDROcodone-acetaminophen  (NORCO) 5-325 mg per tablet Take one tablet by mouth every 8 (eight) hours as needed., Starting Mon 06/04/2017, Print      CONTINUED medications   Details  UNKNOWN TO PATIENT Historical Med       Hospital Course  Physicians involved in care during this hospitalization Attending Provider: Alm LITTIE Aran, MD Attending Provider: Fonda GORMAN Pine, MD Admitting Provider: Fonda GORMAN Pine, MD Anesthesiologist: Toribio DELENA Neas, MD  Indication for Admission: appendicitis  Hospital Course:       Patient presented to emergency department on 06/03/2017 with labs and imaging concerning for appendicitis. She was taken to the OR for below procedure. Afterwards she was admitted to the floor for observation, symptom control. Today she is tolerating pain, ambulating, diet well.  On exam, she is well appearing, well nourished and in NAD. Her incisions are clean and dry without sign of infection.  Pt is heterozygous factor V with MTHFR, spoke with hemeonc concerning anticoagulation requirements post op, and as pt has not had a previous thrombotic episode, they recommend proceeding as if pt does not have coagulopathy, and so we will not prescribe anticoagulation after discharge.  She is found to be hemodynamically stable, afebrile, and ready for discharge home with outpatient follow-up.    Bedside Procedures   No orders found     Procedure(s) (LRB): APPENDECTOMY LAPAROSCOPIC  (N/A)  06/03/2017  Surgeon(s): Fonda GORMAN Pine, MD ------------------- [facility]   Activity Instructions    Activity as tolerated      Driving Restrictions      No driving while taking pain medication   Shower/Bath      You may shower in 48 hours, avoid tubs and swimming, you should sponge bathe until 48 hours     Diet Instructions    No dietary restrictions        Other Instructions    Apply ice pack to operative site for the next 24 hours, alternating 30 minutes on, 30 minutes off or as instructed by physician      DEEP BREATHE AND COUGH AND DRINK A LOT OF FLUIDS every 2 hours today and tomorrow      Follow-up with me      2-3 weeks or as scheduled Please call office for any questions or concerns [place] location [phone] [place] location [phone]   Reason for referral:  JSR post op   Referral Type:  Consultation   Continuity of Care   Keep operative area clean and dry. Do not remove the Dressing unless instructed to Do so by your physician.      Notify Physician for increased pain at the operative site (or unrelieved pain)      Notify Physician for increased shortness of breath      Notify Physician for marked redness      Observe operative area for signs of  infection      Temperature of 100.17F or above increase pain, redness or swelling, foul odor to drainage   Should Not:      Drive a car [employer] Operate power tools Drink alcoholic drinks (even beer) While taking pain medication   Weight restrictions      Lifting restrictions 20  lbs     Contact information for follow-up          Follow-up with me      Next Steps:  Follow up   Instructions:  JSR post op       Unresulted Labs    Order Current Status   Pathology Tissue Request In process      Code Status: Full Code   Electronically signed: Donnice MARLA Dollar, PA-C 06/05/2017 / 7:32 AM

## 2017-06-05 NOTE — Progress Notes (Signed)
 Left message for patient to return my call.

## 2017-06-22 NOTE — Progress Notes (Signed)
"  °  Assessment and Plan   37 year old female 3 weeks status post lap scopic appendectomy for acute appendicitis.  Pathology reviewed.  Consistent with acute appendicitis.  She is doing much better now.  Recommend that she refrain from heavy lifting for 1 more week.  After that can return to unrestricted activities.  Otherwise can follow-up with me as needed.    Patient's Medications       Accurate as of 06/22/17  3:08 PM. Reflects encounter med changes as of last refresh          Continued Medications     Instructions  HYDROcodone-acetaminophen  5-325 mg per tablet Commonly known as:  NORCO  1 tablet, Oral, Every 8 hours as needed   multivitamin tablet  1 tablet, Oral, Daily   NORLYDA 0.35 MG tablet Generic drug:  norethindrone  1 tablet, Oral, At bedtime   UNKNOWN TO PATIENT  No dose, route, or frequency recorded.        Risks, benefits, and alternatives of the medications and treatment plan prescribed today were discussed, and patient expressed understanding. Plan follow-up as discussed or as needed if any worsening symptoms or change in condition.     Subjective   Patient ID:  Amanda Bean is a 37 y.o. (DOB [birth year]) female presents with:     Patient presents with   Post-op    06/03/17 Laparoscopic Appendectomy    General Surgery Post-op Follow-up: Patient here for 3 weeks post laproscopic appendectomy surgery follow-up.  Pain control: The patient is not having any pain.  Denies: fever, wound drainage, increasing redness, pus, increasing pain, increasing swelling Post op problems reported: none  Ambulation: no difficulty  Allergies  Allergen Reactions   Latex Rash    Patient Active Problem List   Diagnosis Date Noted   Acute appendicitis 06/03/2017    Past Medical History, Past Surgical History, Medications, Family History, and Social History were reviewed and updated as appropriate.  Review of Systems   Objective   Vitals:   06/22/17  1447  Temp: 98 F (36.7 C)  TempSrc: Oral  PainSc: 0-No pain    Physical Exam  Exam: Wound clean and dry no evidence of infection.           "

## 2018-01-21 DIAGNOSIS — J019 Acute sinusitis, unspecified: Secondary | ICD-10-CM | POA: Diagnosis not present

## 2018-06-14 DIAGNOSIS — H35411 Lattice degeneration of retina, right eye: Secondary | ICD-10-CM | POA: Diagnosis not present

## 2018-06-14 DIAGNOSIS — H33321 Round hole, right eye: Secondary | ICD-10-CM | POA: Diagnosis not present

## 2019-01-05 DIAGNOSIS — M791 Myalgia, unspecified site: Secondary | ICD-10-CM | POA: Diagnosis not present

## 2019-01-05 DIAGNOSIS — R5381 Other malaise: Secondary | ICD-10-CM | POA: Diagnosis not present

## 2019-01-05 DIAGNOSIS — R51 Headache: Secondary | ICD-10-CM | POA: Diagnosis not present

## 2019-04-22 DIAGNOSIS — Z01419 Encounter for gynecological examination (general) (routine) without abnormal findings: Secondary | ICD-10-CM | POA: Diagnosis not present

## 2019-04-22 DIAGNOSIS — Z1151 Encounter for screening for human papillomavirus (HPV): Secondary | ICD-10-CM | POA: Diagnosis not present

## 2019-04-22 DIAGNOSIS — Z6835 Body mass index (BMI) 35.0-35.9, adult: Secondary | ICD-10-CM | POA: Diagnosis not present

## 2019-06-19 DIAGNOSIS — L700 Acne vulgaris: Secondary | ICD-10-CM | POA: Diagnosis not present

## 2019-07-18 DIAGNOSIS — L700 Acne vulgaris: Secondary | ICD-10-CM | POA: Diagnosis not present

## 2019-07-18 DIAGNOSIS — Z1329 Encounter for screening for other suspected endocrine disorder: Secondary | ICD-10-CM | POA: Diagnosis not present

## 2019-10-03 DIAGNOSIS — Z3200 Encounter for pregnancy test, result unknown: Secondary | ICD-10-CM | POA: Diagnosis not present

## 2019-10-03 DIAGNOSIS — F419 Anxiety disorder, unspecified: Secondary | ICD-10-CM | POA: Diagnosis not present

## 2020-01-08 DIAGNOSIS — N926 Irregular menstruation, unspecified: Secondary | ICD-10-CM | POA: Diagnosis not present

## 2020-01-08 DIAGNOSIS — N943 Premenstrual tension syndrome: Secondary | ICD-10-CM | POA: Diagnosis not present

## 2020-01-08 DIAGNOSIS — F419 Anxiety disorder, unspecified: Secondary | ICD-10-CM | POA: Diagnosis not present

## 2020-01-08 DIAGNOSIS — F329 Major depressive disorder, single episode, unspecified: Secondary | ICD-10-CM | POA: Diagnosis not present

## 2020-02-11 DIAGNOSIS — Z20822 Contact with and (suspected) exposure to covid-19: Secondary | ICD-10-CM | POA: Diagnosis not present

## 2020-02-11 DIAGNOSIS — J069 Acute upper respiratory infection, unspecified: Secondary | ICD-10-CM | POA: Diagnosis not present

## 2020-02-11 DIAGNOSIS — J029 Acute pharyngitis, unspecified: Secondary | ICD-10-CM | POA: Diagnosis not present

## 2021-10-20 DIAGNOSIS — L700 Acne vulgaris: Secondary | ICD-10-CM | POA: Diagnosis not present

## 2022-01-10 DIAGNOSIS — L700 Acne vulgaris: Secondary | ICD-10-CM | POA: Diagnosis not present

## 2022-03-10 DIAGNOSIS — Z6825 Body mass index (BMI) 25.0-25.9, adult: Secondary | ICD-10-CM | POA: Diagnosis not present

## 2022-03-10 DIAGNOSIS — Z01419 Encounter for gynecological examination (general) (routine) without abnormal findings: Secondary | ICD-10-CM | POA: Diagnosis not present

## 2022-03-10 DIAGNOSIS — Z124 Encounter for screening for malignant neoplasm of cervix: Secondary | ICD-10-CM | POA: Diagnosis not present

## 2022-03-21 DIAGNOSIS — R0982 Postnasal drip: Secondary | ICD-10-CM | POA: Diagnosis not present

## 2022-03-21 DIAGNOSIS — J209 Acute bronchitis, unspecified: Secondary | ICD-10-CM | POA: Diagnosis not present

## 2022-03-21 DIAGNOSIS — R051 Acute cough: Secondary | ICD-10-CM | POA: Diagnosis not present

## 2022-03-23 DIAGNOSIS — L700 Acne vulgaris: Secondary | ICD-10-CM | POA: Diagnosis not present

## 2022-03-23 DIAGNOSIS — D6851 Activated protein C resistance: Secondary | ICD-10-CM | POA: Diagnosis not present

## 2022-03-23 DIAGNOSIS — Z79899 Other long term (current) drug therapy: Secondary | ICD-10-CM | POA: Diagnosis not present

## 2022-09-07 DIAGNOSIS — L700 Acne vulgaris: Secondary | ICD-10-CM | POA: Diagnosis not present

## 2022-10-13 DIAGNOSIS — E049 Nontoxic goiter, unspecified: Secondary | ICD-10-CM | POA: Diagnosis not present

## 2022-10-13 DIAGNOSIS — Z7689 Persons encountering health services in other specified circumstances: Secondary | ICD-10-CM | POA: Diagnosis not present

## 2022-10-13 DIAGNOSIS — R002 Palpitations: Secondary | ICD-10-CM | POA: Diagnosis not present

## 2022-10-13 DIAGNOSIS — R5382 Chronic fatigue, unspecified: Secondary | ICD-10-CM | POA: Diagnosis not present

## 2022-10-13 NOTE — Progress Notes (Signed)
 Subjective: Amanda Bean is a 42 y.o.female who presents to the clinic today to establish care.   HPI:  Amanda Bean presents today with concerns with inflammation in her neck. She feels she has swelling around her throat, racing heart, and heart palpations. She has been experiencing weight gain, hair loss, fatigue/exhaustion. Throat swelling has been present for the past year, but has worsened in the past couple of months. She has a family hx of thyroid  disease. She has no difficulty swallowing, but has had some voice changes or her voice feeling froggy.   History reviewed. No pertinent past medical history. Past Surgical History:  Procedure Laterality Date   APPENDECTOMY     CESAREAN SECTION, CLASSICAL     Family History  Problem Relation Name Age of Onset   Hypothyroidism Mother     Hypothyroidism Maternal Aunt     Thyroid  cancer Paternal Aunt     Social History   Socioeconomic History   Marital status: Married    Spouse name: None   Number of children: None   Years of education: None   Highest education level: None  Occupational History   None  Tobacco Use   Smoking status: Never   Smokeless tobacco: Never  Vaping Use   Vaping status: Never Used  Substance and Sexual Activity   Alcohol use: Yes    Comment: socially   Drug use: Never   Sexual activity: Yes    Partners: Male  Other Topics Concern   None  Social History Narrative   None   Social Determinants of Health   Food Insecurity: Low Risk  (10/12/2022)   Food vital sign    Within the past 12 months, you worried that your food would run out before you got money to buy more: Never true    Within the past 12 months, the food you bought just didn't last and you didn't have money to get more: Never true  Transportation Needs: No Transportation Needs (10/12/2022)   Transportation    In the past 12 months, has lack of reliable transportation kept you from medical appointments, meetings, work or  from getting things needed for daily living? : No  Safety: Not on file  Living Situation: Low Risk  (10/12/2022)   Living Situation    What is your living situation today?: I have a steady place to live    Think about the place you live. Do you have problems with any of the following? Choose all that apply:: None/None on this list    Health Maintenance  Topic Date Due   Comprehensive Annual Visit  Never done   Depression Screening  Never done   Varicella Vaccines (1 of 2 - 13+ 2-dose series) Never done   HIV Screening  Never done   Hepatitis C Screening  Never done   DTaP/Tdap/Td Vaccines (1 - Tdap) Never done   Hepatitis B Vaccines (1 of 3 - 19+ 3-dose series) Never done   Cervical Cancer Screening  Never done   COVID-19 Vaccine (5 - 2023-24 season) 01/06/2022   Influenza Vaccine (Season Ended) 12/07/2022   ZOSTER VACCINE (1 of 2) 10/16/2032   Pneumococcal Vaccine: Pediatrics (0 to 5 years) and At-Risk Patients (6-64 Years)  Aged Out   HIB Vaccines  Aged Out   IPV Vaccines  Aged Out   Hepatitis A Vaccines  Aged Out   Meningococcal Conjugate (ACWY) Vaccine  Aged Out   Rotavirus Vaccines  Aged Out   HPV Vaccines  Aged  Out    There is no immunization history on file for this patient.   Review of systems:    All other pertinent positive and negative aspects of the ROS are detailed in the above subjective/HPI section.   Physical Exam:  BP 132/84 (BP Location: Left arm, Patient Position: Sitting)   Pulse 69   Temp 97.1 F (36.2 C) (Temporal)   Ht 1.679 m (5' 6.1)   Wt 75.9 kg (167 lb 6.4 oz)   LMP  (LMP Unknown)   SpO2 98%   BMI 26.94 kg/m  Body mass index is 26.94 kg/m.  Physical Examination:   Physical Exam Constitutional:      Appearance: Normal appearance.  Neck:     Thyroid : Thyromegaly present.  Cardiovascular:     Rate and Rhythm: Normal rate and regular rhythm.     Heart sounds: Normal heart sounds.  Pulmonary:     Effort:  Pulmonary effort is normal.     Breath sounds: Normal breath sounds.  Musculoskeletal:     Cervical back: Normal range of motion.  Lymphadenopathy:     Cervical: Cervical adenopathy present.  Skin:    General: Skin is warm and dry.     Capillary Refill: Capillary refill takes less than 2 seconds.  Neurological:     Mental Status: She is alert and oriented to person, place, and time.        Asssement Amanda Bean was seen today for establish care.  Diagnoses and all orders for this visit:  Encounter to establish care  Thyroid  goiter -     TSH; Future -     T4, Free; Future -     Sedimentation Rate (ESR) -     C-Reactive Protein (CRP)  Palpitations -     CBC with Differential -     Magnesium  -     Comprehensive Metabolic Panel  Chronic fatigue -     Sedimentation Rate (ESR) -     C-Reactive Protein (CRP)  Plan today is to screen for thyroid  disorder and other sources of fatigue and heart palpitations. Will obtain an US  of thyroid  to evaluate goiter.   Requested Prescriptions    No prescriptions requested or ordered in this encounter    Return in about 6 months (around 04/14/2023) for Annual physical.  Electronically signed by: Lonney Canny, FNP-C 10/13/2022 2:25 PM

## 2022-10-16 NOTE — Telephone Encounter (Signed)
 Labs did look normal. I would like for her to still go ahead with the ultrasound.

## 2022-10-16 NOTE — Telephone Encounter (Signed)
 Patient had labs on Friday and they came back normal, she is scheduled for an ultrasound and would like to know if she should still keep this appointment.  Her call back number is on file.

## 2022-10-17 NOTE — Telephone Encounter (Signed)
 Pt informed

## 2022-10-23 DIAGNOSIS — E049 Nontoxic goiter, unspecified: Secondary | ICD-10-CM | POA: Diagnosis not present

## 2022-10-26 ENCOUNTER — Telehealth: Payer: Self-pay

## 2022-10-26 NOTE — Telephone Encounter (Signed)
Pt Betel Bertola MRN [id number] would like you Est Care for his wife Albert.

## 2022-10-26 NOTE — Telephone Encounter (Signed)
Est.Care with who?

## 2022-10-30 NOTE — Telephone Encounter (Signed)
[place]

## 2022-10-31 NOTE — Telephone Encounter (Signed)
As spouse of current patient I agree to see her, but it looks like she just had an establish care visit with other provider at [facility] few weeks ago. I can see her instead if they prefer - would need establish care appt. Thanks.

## 2022-11-01 NOTE — Telephone Encounter (Signed)
Left vm for patient to call back and schedule

## 2023-01-21 DIAGNOSIS — S67198A Crushing injury of other finger, initial encounter: Secondary | ICD-10-CM | POA: Diagnosis not present

## 2023-01-21 DIAGNOSIS — S6992XA Unspecified injury of left wrist, hand and finger(s), initial encounter: Secondary | ICD-10-CM | POA: Diagnosis not present

## 2023-05-24 DIAGNOSIS — L700 Acne vulgaris: Secondary | ICD-10-CM | POA: Diagnosis not present

## 2023-07-24 ENCOUNTER — Encounter: Payer: Self-pay | Admitting: Student in an Organized Health Care Education/Training Program

## 2023-07-24 ENCOUNTER — Ambulatory Visit (INDEPENDENT_AMBULATORY_CARE_PROVIDER_SITE_OTHER): Payer: Self-pay | Admitting: Student in an Organized Health Care Education/Training Program

## 2023-07-24 VITALS — BP 116/70 | HR 65 | Wt 170.0 lb

## 2023-07-24 DIAGNOSIS — R635 Abnormal weight gain: Secondary | ICD-10-CM | POA: Insufficient documentation

## 2023-07-24 DIAGNOSIS — D6851 Activated protein C resistance: Secondary | ICD-10-CM | POA: Diagnosis not present

## 2023-07-24 DIAGNOSIS — G2581 Restless legs syndrome: Secondary | ICD-10-CM | POA: Diagnosis not present

## 2023-07-24 DIAGNOSIS — N912 Amenorrhea, unspecified: Secondary | ICD-10-CM | POA: Insufficient documentation

## 2023-07-24 DIAGNOSIS — L700 Acne vulgaris: Secondary | ICD-10-CM

## 2023-07-24 DIAGNOSIS — L709 Acne, unspecified: Secondary | ICD-10-CM | POA: Insufficient documentation

## 2023-07-24 DIAGNOSIS — L219 Seborrheic dermatitis, unspecified: Secondary | ICD-10-CM | POA: Insufficient documentation

## 2023-07-24 LAB — COMPREHENSIVE METABOLIC PANEL
ALT: 17 U/L (ref 0–35)
AST: 16 U/L (ref 0–37)
Albumin: 4.8 g/dL (ref 3.5–5.2)
Alkaline Phosphatase: 70 U/L (ref 39–117)
BUN: 17 mg/dL (ref 6–23)
CO2: 29 meq/L (ref 19–32)
Calcium: 9.6 mg/dL (ref 8.4–10.5)
Chloride: 102 meq/L (ref 96–112)
Creatinine, Ser: 0.74 mg/dL (ref 0.40–1.20)
GFR: 100.96 mL/min (ref >60.00)
Glucose, Bld: 87 mg/dL (ref 70–99)
Potassium: 4.4 meq/L (ref 3.5–5.1)
Sodium: 139 meq/L (ref 135–145)
Total Bilirubin: 1.1 mg/dL (ref 0.2–1.2)
Total Protein: 7 g/dL (ref 6.0–8.3)

## 2023-07-24 LAB — CBC WITH DIFFERENTIAL/PLATELET
Basophils Absolute: 0 10*3/uL (ref 0.0–0.1)
Basophils Relative: 0.4 % (ref 0.0–3.0)
Eosinophils Absolute: 0 10*3/uL (ref 0.0–0.7)
Eosinophils Relative: 0.8 % (ref 0.0–5.0)
HCT: 40.4 % (ref 36.0–46.0)
Hemoglobin: 13.7 g/dL (ref 12.0–15.0)
Lymphocytes Relative: 24.7 % (ref 12.0–46.0)
Lymphs Abs: 1.3 10*3/uL (ref 0.7–4.0)
MCHC: 33.9 g/dL (ref 30.0–36.0)
MCV: 92.5 fl (ref 78.0–100.0)
Monocytes Absolute: 0.4 10*3/uL (ref 0.1–1.0)
Monocytes Relative: 8 % (ref 3.0–12.0)
Neutro Abs: 3.6 10*3/uL (ref 1.4–7.7)
Neutrophils Relative %: 66.1 % (ref 43.0–77.0)
Platelets: 190 10*3/uL (ref 150.0–400.0)
RBC: 4.37 Mil/uL (ref 3.87–5.11)
RDW: 13.1 % (ref 11.5–15.5)
WBC: 5.4 10*3/uL (ref 4.0–10.5)

## 2023-07-24 LAB — FOLLICLE STIMULATING HORMONE: FSH: 2.5 m[IU]/mL

## 2023-07-24 LAB — LIPID PANEL
Cholesterol: 200 mg/dL (ref 0–200)
HDL: 72.4 mg/dL (ref >39.00)
LDL Cholesterol: 112 mg/dL — ABNORMAL HIGH (ref 0–99)
NonHDL: 127.14
Total CHOL/HDL Ratio: 3
Triglycerides: 75 mg/dL (ref 0.0–149.0)
VLDL: 15 mg/dL (ref 0.0–40.0)

## 2023-07-24 LAB — VITAMIN D 25 HYDROXY (VIT D DEFICIENCY, FRACTURES): VITD: 26 ng/mL — ABNORMAL LOW (ref 30.00–100.00)

## 2023-07-24 LAB — TSH: TSH: 1.65 u[IU]/mL (ref 0.35–5.50)

## 2023-07-24 LAB — VITAMIN B12: Vitamin B-12: 294 pg/mL (ref 211–911)

## 2023-07-24 LAB — LUTEINIZING HORMONE: LH: 2.66 m[IU]/mL

## 2023-07-24 LAB — HEMOGLOBIN A1C: Hgb A1c MFr Bld: 5.7 % (ref 4.6–6.5)

## 2023-07-24 MED ORDER — FLUOCINOLONE ACETONIDE 0.01 % OT OIL: 5.0000 [drp] | TOPICAL_OIL | Freq: Two times a day (BID) | OTIC | 1 refills | Status: AC

## 2023-07-24 MED ORDER — CLOTRIMAZOLE 1 % EX SOLN: 1.0000 | Freq: Two times a day (BID) | CUTANEOUS | 0 refills | Status: AC

## 2023-07-24 NOTE — Assessment & Plan Note (Signed)
 Chronic problem for over 1 year.  Patient has been doing restricted eating with a goal for weight loss 160 pounds but has not seen any benefits over the last 1 year of careful dieting and exercise.  She reports calorie restriction of about 1500 cal/day which should be more than adequate to cause about 1 pound weight loss per week.  Based on her height and weights I think her maintenance calories would be around 2200 cal a day.  No issues with depressed mood.  No edema on exam.  Very functional.  No signs of organomegaly.  Exercising well.  Will check TSH, CMP, vitamin D, B12, lipids, and A1c.

## 2023-07-24 NOTE — Assessment & Plan Note (Signed)
 Chronic problem.  Managed with dermatology.  Currently using spironolactone and a topical retinol and topical antibiotic.  Acne is well-controlled today.  No scars or cysts.  She has not used systemic retinoid in the past, worried about adverse side effects.  Really wants to continue with the current regimen because it has been effective for her.

## 2023-07-24 NOTE — Assessment & Plan Note (Signed)
 Chronic problem for about 2 years since starting spironolactone.  This medicine may cause amenorrhea, can decrease circulating estrogen usually does not impact the at the [facility] for LH levels.  Other possibility is early onset menopause.  She does have a family history of this, but no other vasomotor symptoms.  Will plan to rule out menopause by checking estrogen, FSH, and LH levels.  She is not seeking pregnancy right now.  If she is estrogen deficient because of menopause, we can talk about hormone replacement therapy.  If it is due to the spironolactone, we can talk about options of continuing or changing acne treatment.

## 2023-07-24 NOTE — Assessment & Plan Note (Signed)
 Acute problem of intense itching of the left ear canal for several weeks.  Exam is reassuring, no cerumen impaction.  No signs on exam of psoriasis, otitis externa, contact dermatitis, or fungal infection.  She does have some spots of flaky, greasy patches along the ear canal skin that looks like seborrheic dermatitis to me.  Will plan to treat this with a otic clotrimazole solution twice daily for 10 days as well as low potency fluocinolone steroid 5 drops twice daily for symptom relief.

## 2023-07-24 NOTE — Assessment & Plan Note (Signed)
 Chronic problem.  Moderate symptom burden.  Does well with exercise.  Will plan to check iron studies today and do iron repletion if she is iron deficient.

## 2023-07-24 NOTE — Assessment & Plan Note (Signed)
 Family history of hypercoagulable states with clotting disorder.  Patient has never had a VTE.  She did use Lovenox for her first pregnancy and then low-dose aspirin for her second pregnancy to prophylax from a VTE.  Currently she is low risk with active lifestyle, no history of VTE, so no prophylaxis needed now.

## 2023-07-24 NOTE — Progress Notes (Signed)
 New Patient Office Visit  Subjective    Patient ID: WAFA MARTES, female    DOB: December 22, 1980  Age: 43 y.o. MRN: [id number]  CC:  Chief Complaint  Patient presents with   Establish Care    Would like to discuss a few things with thyroid     HPI Amanda Bean presents to establish care.  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  She reports working hard on healthy eating, uses an app to track her calories and reports consistent daily calorie intake around 1500 cal/day.  Her weight has fluctuated in the past, however she has found it very difficult to lose any weight over the last 1 year.  At times she has a general feeling of tiredness, feels some tingling sensation and restlessness in her bilateral legs, denies pica.  She has had amenorrhea since starting spironolactone for acne about 2 years ago.  Denies alcohol use, no tobacco use, no other drug use.  She lives with her husband and 2 children aged 22 and 22.  Reports things are going well at home, low stress.  She works part-time as a International aid/development worker at [employer], and joys that work.  Denies any recent fevers or chills, no chest pain or shortness of breath, no recent hospitalizations or acute illness.  No recent changes in her medications.  Denies constipation.  Denies any vasomotor symptoms.  She does report intense itching in her left ear canal for several weeks.    Outpatient Encounter Medications as of 07/24/2023  Medication Sig   clotrimazole (LOTRIMIN) 1 % external solution Apply 1 Application topically 2 (two) times daily.   Fluocinolone Acetonide 0.01 % OIL Place 5 drops in ear(s) in the morning and at bedtime for 10 days.   magnesium oxide (MAG-OX) 400 (241.3 MG) MG tablet Take 0.5 tablets (200 mg total) by mouth daily.   spironolactone (ALDACTONE) 100 MG tablet Take 100 mg by mouth daily.   [DISCONTINUED] acetaminophen  (TYLENOL) 500 MG tablet Take 2 tablets (1,000 mg total) by mouth every 4 (four) hours as needed for mild pain (for pain scale < 4). (Patient not taking: Reported on 07/24/2023)   [DISCONTINUED] aspirin 81 MG chewable tablet Chew 1 tablet (81 mg total) by mouth daily. Resume daily preventative therapy AFTER completion of Lovenox postpartum (Patient not taking: Reported on 07/24/2023)   [DISCONTINUED] enoxaparin (LOVENOX) 40 MG/0.4ML injection Inject 0.4 mLs (40 mg total) into the skin daily. Take daily x 6 weeks postpartum - states that she has 2 week supply at home (Patient not taking: Reported on 07/24/2023)   [DISCONTINUED] ferrous fumarate (HEMOCYTE - 106 MG FE) 325 (106 FE) MG TABS Take 1 tablet by mouth. (Patient not taking: Reported on 07/24/2023)   [DISCONTINUED] Prenatal MV-Min-Fe Fum-FA-DHA (PRENATAL 1 PO) Take 1 each by mouth daily.   (Patient not taking: Reported on 07/24/2023)   [DISCONTINUED] traMADol (ULTRAM) 50 MG tablet Take 1 tablet (50 mg total) by mouth every 6 (six) hours. (Patient not taking: Reported on 07/24/2023)   No facility-administered encounter medications on file as of 07/24/2023.    Past Medical History:  Diagnosis Date   Anemia    Dysrhythmia    palpitations   Factor V Leiden mutation (HCC)    Factor V Leiden mutation complicating pregnancy (HCC) 09/28/2011   History of chicken pox    MTHFR deficiency complicating pregnancy, unspecified  trimester [facility])    Postpartum care following cesarean delivery (3/22) 07/28/2014   Rupture, membranes, premature 07/28/2014   Unspecified sinusitis (chronic)     Past Surgical History:  Procedure Laterality Date   CESAREAN SECTION  09/29/2011   Procedure: CESAREAN SECTION;  Surgeon: Genia Del, MD;  Location: [facility];  Service: Gynecology;  Laterality: N/A;   CESAREAN SECTION N/A 07/28/2014   Procedure: CESAREAN SECTION;  Surgeon: Olivia Mackie, MD;  Location: [facility];  Service: Obstetrics;  Laterality: N/A;   WISDOM TOOTH  EXTRACTION      Family History  Problem Relation Age of Onset   Thyroid disease Mother    Other Mother        hemoglobinopathy   Factor V Leiden deficiency Mother    Alcohol abuse Brother    Thyroid disease Maternal Aunt    Other Maternal Aunt        hemoglobinopathy   Factor V Leiden deficiency Maternal Aunt    Thyroid disease Maternal Grandmother    Stroke Maternal Grandmother    Depression Maternal Grandmother    Anesthesia problems Cousin        allergic to anesthesia    Social History   Socioeconomic History   Marital status: Married    Spouse name: Not on file   Number of children: Not on file   Years of education: Not on file   Highest education level: Not on file  Occupational History   Not on file  Tobacco Use   Smoking status: Former    Current packs/day: 0.00    Types: Cigarettes    Quit date: 09/25/2002    Years since quitting: 20.8   Smokeless tobacco: Never  Substance and Sexual Activity   Alcohol use: No   Drug use: No   Sexual activity: Yes  Other Topics Concern   Not on file  Social History Narrative   Not on file   Social Drivers of Health   Financial Resource Strain: Not on file  Food Insecurity: Low Risk  (10/12/2022)   [facility]   Hunger Vital Sign    Worried About Running Out of Food in the Last Year: Never true    Ran Out of Food in the Last Year: Never true  Transportation Needs: Not on file (10/12/2022)  Physical Activity: Not on file  Stress: Not on file  Social Connections: Not on file  Intimate Partner Violence: Not on file          Objective    BP 116/70   Pulse 65   Wt 170 lb (77.1 kg)   SpO2 99%   BMI 25.85 kg/m   Physical Exam  Gen: Well-appearing woman Eyes: Normal Ears: Normal tympanic membranes bilaterally, both sides have a small amount of flaky, greasy, plaques along the ear canal, no cerumen, no other plaques or rashes, small amount of erythema around the flakes. Neck:  Normal feeling thyroid, no nodules or adenopathy CV: Regular, no murmur Lungs: Unlabored, clear throughout Abd: Soft, nontender, no organomegaly Ext: Warm, trace lower extremity edema at the mid shins, normal joints Psych: Appropriate mood and affect, not depressed or anxious.    Assessment & Plan:   Problem List Items Addressed This Visit       High   Weight gain (Chronic)   Chronic problem for over 1 year.  Patient has been doing restricted eating with a goal for weight loss 160 pounds but has not seen any benefits over the  last 1 year of careful dieting and exercise.  She reports calorie restriction of about 1500 cal/day which should be more than adequate to cause about 1 pound weight loss per week.  Based on her height and weights I think her maintenance calories would be around 2200 cal a day.  No issues with depressed mood.  No edema on exam.  Very functional.  No signs of organomegaly.  Exercising well.  Will check TSH, CMP, vitamin D, B12, lipids, and A1c.      Relevant Orders   Comprehensive metabolic panel   VITAMIN D 25 Hydroxy (Vit-D Deficiency, Fractures)   Vitamin B12   Hemoglobin A1c   CBC with Differential/Platelet   TSH   Lipid panel     Medium    Amenorrhea - Primary (Chronic)   Chronic problem for about 2 years since starting spironolactone.  This medicine may cause amenorrhea, can decrease circulating estrogen usually does not impact the at the [facility] for LH levels.  Other possibility is early onset menopause.  She does have a family history of this, but no other vasomotor symptoms.  Will plan to rule out menopause by checking estrogen, FSH, and LH levels.  She is not seeking pregnancy right now.  If she is estrogen deficient because of menopause, we can talk about hormone replacement therapy.  If it is due to the spironolactone, we can talk about options of continuing or changing acne treatment.      Relevant Orders   Estrogens, total   LH   FSH   Restless leg  syndrome (Chronic)   Chronic problem.  Moderate symptom burden.  Does well with exercise.  Will plan to check iron studies today and do iron repletion if she is iron deficient.      Relevant Orders   Iron, TIBC and Ferritin Panel   Acne (Chronic)   Chronic problem.  Managed with dermatology.  Currently using spironolactone and a topical retinol and topical antibiotic.  Acne is well-controlled today.  No scars or cysts.  She has not used systemic retinoid in the past, worried about adverse side effects.  Really wants to continue with the current regimen because it has been effective for her.        Low   Factor V Leiden (HCC) (Chronic)   Family history of hypercoagulable states with clotting disorder.  Patient has never had a VTE.  She did use Lovenox for her first pregnancy and then low-dose aspirin for her second pregnancy to prophylax from a VTE.  Currently she is low risk with active lifestyle, no history of VTE, so no prophylaxis needed now.        Unprioritized   Seborrheic dermatitis (Chronic)   Acute problem of intense itching of the left ear canal for several weeks.  Exam is reassuring, no cerumen impaction.  No signs on exam of psoriasis, otitis externa, contact dermatitis, or fungal infection.  She does have some spots of flaky, greasy patches along the ear canal skin that looks like seborrheic dermatitis to me.  Will plan to treat this with a otic clotrimazole solution twice daily for 10 days as well as low potency fluocinolone steroid 5 drops twice daily for symptom relief.      Relevant Medications   Fluocinolone Acetonide 0.01 % OIL   clotrimazole (LOTRIMIN) 1 % external solution    Return in about 3 months (around 10/24/2023).   Tyson Alias, MD

## 2023-07-30 ENCOUNTER — Telehealth: Payer: Self-pay

## 2023-07-30 LAB — IRON,TIBC AND FERRITIN PANEL
%SAT: 28 % (ref 16–45)
Ferritin: 47 ng/mL (ref 16–154)
Iron: 85 ug/dL (ref 40–190)
TIBC: 307 ug/dL (ref 250–450)

## 2023-07-30 LAB — ESTROGENS, TOTAL: Estrogen: 362 pg/mL

## 2023-07-30 NOTE — Telephone Encounter (Signed)
 Did you call this patient per chance?

## 2023-07-30 NOTE — Telephone Encounter (Signed)
 I did, thank you, I spoke with her by phone about lab results.

## 2023-07-30 NOTE — Telephone Encounter (Signed)
 Copied from CRM [id number]. Topic: Clinical - Lab/Test Results >> Jul 30, 2023  1:28 PM Melissa C wrote: Reason for CRM: patient missed a call from the doctor regarding bloodwork. Please give patient a call back. Thank you

## 2023-10-29 ENCOUNTER — Ambulatory Visit: Admitting: Student in an Organized Health Care Education/Training Program

## 2023-10-30 DIAGNOSIS — H18821 Corneal disorder due to contact lens, right eye: Secondary | ICD-10-CM | POA: Diagnosis not present

## 2024-04-07 DIAGNOSIS — Z135 Encounter for screening for eye and ear disorders: Secondary | ICD-10-CM | POA: Diagnosis not present

## 2024-04-07 DIAGNOSIS — H04123 Dry eye syndrome of bilateral lacrimal glands: Secondary | ICD-10-CM | POA: Diagnosis not present

## 2024-05-30 ENCOUNTER — Emergency Department (HOSPITAL_COMMUNITY)

## 2024-05-30 ENCOUNTER — Emergency Department (HOSPITAL_COMMUNITY)
Admission: EM | Admit: 2024-05-30 | Discharge: 2024-05-30 | Disposition: A | Discharge status: Home / Self Care | Source: Ambulatory Visit | Attending: Emergency Medicine | Admitting: Emergency Medicine

## 2024-05-30 DIAGNOSIS — H538 Other visual disturbances: Secondary | ICD-10-CM | POA: Diagnosis not present

## 2024-05-30 DIAGNOSIS — M542 Cervicalgia: Secondary | ICD-10-CM | POA: Insufficient documentation

## 2024-05-30 DIAGNOSIS — Z91040 Latex allergy status: Secondary | ICD-10-CM | POA: Insufficient documentation

## 2024-05-30 LAB — CBC WITH DIFFERENTIAL/PLATELET
Abs Immature Granulocytes: 0.02 K/uL (ref 0.00–0.07)
Basophils Absolute: 0 K/uL (ref 0.0–0.1)
Basophils Relative: 1 %
Eosinophils Absolute: 0.1 K/uL (ref 0.0–0.5)
Eosinophils Relative: 1 %
HCT: 38.7 % (ref 36.0–46.0)
Hemoglobin: 13.3 g/dL (ref 12.0–15.0)
Immature Granulocytes: 0 %
Lymphocytes Relative: 26 %
Lymphs Abs: 1.6 K/uL (ref 0.7–4.0)
MCH: 31.4 pg (ref 26.0–34.0)
MCHC: 34.4 g/dL (ref 30.0–36.0)
MCV: 91.5 fL (ref 80.0–100.0)
Monocytes Absolute: 0.5 K/uL (ref 0.1–1.0)
Monocytes Relative: 8 %
Neutro Abs: 4 K/uL (ref 1.7–7.7)
Neutrophils Relative %: 64 %
Platelets: 221 K/uL (ref 150–400)
RBC: 4.23 MIL/uL (ref 3.87–5.11)
RDW: 12.4 % (ref 11.5–15.5)
WBC: 6.2 K/uL (ref 4.0–10.5)
nRBC: 0 % (ref 0.0–0.2)

## 2024-05-30 LAB — BASIC METABOLIC PANEL WITH GFR
Anion gap: 9 (ref 5–15)
BUN: 17 mg/dL (ref 6–20)
CO2: 27 mmol/L (ref 22–32)
Calcium: 9.5 mg/dL (ref 8.9–10.3)
Chloride: 103 mmol/L (ref 98–111)
Creatinine, Ser: 0.83 mg/dL (ref 0.44–1.00)
GFR, Estimated: >60 mL/min
Glucose, Bld: 116 mg/dL — ABNORMAL HIGH (ref 70–99)
Potassium: 3.7 mmol/L (ref 3.5–5.1)
Sodium: 139 mmol/L (ref 135–145)

## 2024-05-30 LAB — SEDIMENTATION RATE: Sed Rate: 7 mm/h (ref 0–22)

## 2024-05-30 LAB — C-REACTIVE PROTEIN: CRP: <0.5 mg/dL

## 2024-05-30 MED ORDER — IOHEXOL 350 MG/ML SOLN
75.0000 mL | Freq: Once | INTRAVENOUS | Status: AC | PRN
  Administered 2024-05-30: 75 mL via INTRAVENOUS

## 2024-05-30 NOTE — ED Triage Notes (Addendum)
 Patient reports blurred vision to right eye that started last week, right sided neck pain since December. Sent here for ophthalmology. Patient is alert and oriented x 4. Airway patent, respirations even and unlabored. Skin normal, warm and dry. PERRLA. No unilateral weakness/loss of sensation. Patient does have slight drop to corner of right eye but no obvious asymmetry.

## 2024-05-30 NOTE — Discharge Instructions (Signed)
 You are seen today in the ER for blurred vision.  It was recommended that you follow-up with neurology and ophthalmology.  I have placed the neurology consult so you should hear from them soon.  I have also placed the number to call for the ophthalmologist call them as soon as you can to schedule an appointment.  If you have any worsening symptoms, pain, decreased vision, vision loss, or any other new symptoms please return to the ER.

## 2024-05-30 NOTE — ED Provider Notes (Signed)
 " [place] EMERGENCY DEPARTMENT AT [facility] Provider Note   CSN: [id number] Arrival date & time: 05/30/24  8151     Patient presents with: Blurred Vision and Neck Pain   Amanda Bean is a 44 y.o. female.    Neck Pain 44 year old female presenting to the ER after being sent over by her eye doctor for blurred vision.  Patient was sent over by her optometrist for some blurred vision that she has been having for a few months now.  She is also complaining of some neck pain that has been going on for the past week.  Her optometrist was concerned for possible horners syndrome so sent her to the ER for further workup.  Patient denies any symptoms except for the neck pain and blurred vision.  She denies any pain in her eye.     Prior to Admission medications  Medication Sig Start Date End Date Taking? Authorizing Provider  clotrimazole  (LOTRIMIN ) 1 % external solution Apply 1 Application topically 2 (two) times daily. 07/24/23   Jerrell Cleatus Ned, MD  magnesium  oxide (MAG-OX) 400 (241.3 MG) MG tablet Take 0.5 tablets (200 mg total) by mouth daily. 07/30/14   Con Merle, CNM  spironolactone (ALDACTONE) 100 MG tablet Take 100 mg by mouth daily.    [provider]    Allergies: Latex    Review of Systems  Musculoskeletal:  Positive for neck pain.  All other systems reviewed and are negative.   Updated Vital Signs BP 115/71 (BP Location: Right Arm)   Pulse (!) 51   Temp 98 F (36.7 C)   Resp 16   SpO2 99%   Physical Exam Vitals and nursing note reviewed.  Eyes:     General: Lids are normal. No scleral icterus.       Right eye: No discharge or hordeolum.        Left eye: No discharge or hordeolum.     Extraocular Movements: Extraocular movements intact.     Right eye: Normal extraocular motion and no nystagmus.     Left eye: Normal extraocular motion and no nystagmus.     Conjunctiva/sclera: Conjunctivae normal.     Pupils: Pupils are equal,  round, and reactive to light.     Comments: Negative afferent pupillary defect.  Pulmonary:     Effort: Pulmonary effort is normal.  Musculoskeletal:       Arms:     Comments: Normal range of motion.  No rashes.  No bruising.  Skin:    General: Skin is warm and dry.     Coloration: Skin is not jaundiced.     Findings: No rash.  Neurological:     General: No focal deficit present.     Mental Status: She is alert.     (all labs ordered are listed, but only abnormal results are displayed) Labs Reviewed  BASIC METABOLIC PANEL WITH GFR  CBC WITH DIFFERENTIAL/PLATELET    EKG: None  Radiology: No results found.   Procedures   Medications Ordered in the ED - No data to display                                  Medical Decision Making Amount and/or Complexity of Data Reviewed Labs: ordered. Radiology: ordered.   Impression: 44 year old female presenting with blurred vision.  Differential diagnoses include optic neuritis, Horner syndrome, foreign body, neurosyphilis  Additional History: Patient is a  provide history.  I also reviewed other outpatient notes including her optometrist note from earlier today.  Labs: CBC showed no acute abnormalities.  BMP showed no acute abnormalities.  C-reactive protein, sed rate, RPR were ordered.  Imaging: CT angio head and neck showed no acute changes.  Consult: I requested consult from neuro Dr. Michaela, patient we discussed the labs and imaging and they recommend to do an afferent pupillary test to check for afferent pupillary defect.  Since this was negative he recommended that she follow-up with neurology and ophthalmology outpatient.  ED Course/Meds: 44 year old female presenting to the ED with blurred vision.  Patient was well-appearing in no acute distress patient was sent over from her optometrist for possible Horner syndrome.  On physical exam there were no eye abnormalities other than the blurred vision.  Patient pupil  response was equal and reactive.  There was no discharge noted in the eye.  Her extraocular movements were intact.  No nystagmus noted patient had some pain to palpation in her lower neck into her shoulder.  However still had great range of motion.  Due to patient having on and off again eye problems in the right eye since May and the blurred vision since the new years, optic neuritis was less likely.  I spoke with neurology and they recommended that that since this has been going on for a while and she had a negative afferent pupillary defect she could follow-up outpatient with neurology and ophthalmology outpatient.  I discussed this plan with the patient and she agreed to the above-stated plan.  Educated on signs and symptoms of when to return to the ER and she also agreed to this.  Patient remained stable while in the ER and at discharge.      Final diagnoses:  None    ED Discharge Orders     None          Amanda Bean 05/30/24 2203    Mannie Pac T, DO 05/30/24 2328  "

## 2024-05-31 LAB — SYPHILIS: RPR W/REFLEX TO RPR TITER AND TREPONEMAL ANTIBODIES, TRADITIONAL SCREENING AND DIAGNOSIS ALGORITHM: RPR Ser Ql: NONREACTIVE
